# Patient Record
Sex: Female | Born: 1970 | Race: Black or African American | Hispanic: No | Marital: Single | Smoking: Current some day smoker
Health system: Southern US, Community
[De-identification: ages and names within clinical notes are randomized; demographics above are authoritative.]

## PROBLEM LIST (undated history)

## (undated) DIAGNOSIS — J45909 Unspecified asthma, uncomplicated: Secondary | ICD-10-CM

## (undated) DIAGNOSIS — F419 Anxiety disorder, unspecified: Secondary | ICD-10-CM

## (undated) HISTORY — PX: ANKLE SURGERY: SHX546

## (undated) HISTORY — PX: BELPHAROPTOSIS REPAIR: SHX369

## (undated) HISTORY — PX: SKIN GRAFT: SHX250

---

## 2012-09-19 ENCOUNTER — Emergency Department (HOSPITAL_COMMUNITY)
Admission: EM | Admit: 2012-09-19 | Discharge: 2012-09-19 | Disposition: A | Discharge status: Home / Self Care | Payer: Self-pay | Attending: Emergency Medicine | Admitting: Emergency Medicine

## 2012-09-19 ENCOUNTER — Encounter (HOSPITAL_COMMUNITY): Payer: Self-pay | Admitting: Emergency Medicine

## 2012-09-19 ENCOUNTER — Emergency Department (HOSPITAL_COMMUNITY): Payer: Self-pay

## 2012-09-19 DIAGNOSIS — F17200 Nicotine dependence, unspecified, uncomplicated: Secondary | ICD-10-CM | POA: Insufficient documentation

## 2012-09-19 DIAGNOSIS — Z8659 Personal history of other mental and behavioral disorders: Secondary | ICD-10-CM | POA: Insufficient documentation

## 2012-09-19 DIAGNOSIS — R0789 Other chest pain: Secondary | ICD-10-CM | POA: Insufficient documentation

## 2012-09-19 DIAGNOSIS — R0602 Shortness of breath: Secondary | ICD-10-CM | POA: Insufficient documentation

## 2012-09-19 DIAGNOSIS — M25519 Pain in unspecified shoulder: Secondary | ICD-10-CM | POA: Insufficient documentation

## 2012-09-19 HISTORY — DX: Anxiety disorder, unspecified: F41.9

## 2012-09-19 LAB — CBC
Hemoglobin: 13.5 g/dL (ref 12.0–15.0)
MCHC: 36.1 g/dL — ABNORMAL HIGH (ref 30.0–36.0)
RDW: 13.6 % (ref 11.5–15.5)
WBC: 8 10*3/uL (ref 4.0–10.5)

## 2012-09-19 LAB — BASIC METABOLIC PANEL
Chloride: 100 mEq/L (ref 96–112)
GFR calc Af Amer: >90 mL/min (ref >90)
GFR calc non Af Amer: >90 mL/min (ref >90)
Potassium: 3.5 mEq/L (ref 3.5–5.1)
Sodium: 137 mEq/L (ref 135–145)

## 2012-09-19 LAB — POCT I-STAT TROPONIN I

## 2012-09-19 LAB — RAPID URINE DRUG SCREEN, HOSP PERFORMED
Cocaine: NOT DETECTED
Opiates: NOT DETECTED

## 2012-09-19 MED ORDER — NAPROXEN 500 MG PO TABS: 500.0000 mg | ORAL_TABLET | Freq: Two times a day (BID) | ORAL | Status: DC

## 2012-09-19 NOTE — ED Notes (Addendum)
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 1 month ago, couldn't stand up due to pain in back of neck. Chest pain started today, left shoulder pain, left arm pain, hurts on inspiration. Denies n/v.

## 2012-09-19 NOTE — ED Provider Notes (Signed)
CSN: [id number]     Arrival date & time 09/19/12  1537 History     First MD Initiated Contact with Patient 09/19/12 1706     Chief Complaint  Patient presents with  . Chest Pain   (Consider location/radiation/quality/duration/timing/severity/associated sxs/prior Treatment) HPI Yolanda Norris is a 42 y.o. female who presents to ED with complaint of a chest pain. States she has had intermittent sharp and at times pressure like chest pains that are in different areas of her left chest. States they are non exertional. Sometimes occur at rest, sometimes when doing something. States she has shortness of breath only when she takes a deep breath. Denies SOB on excerption. No recent travel, no OCPs. No surgeries. Hx of anxiety, and also states recent loss of her mother. Pt states also has been reading a lot on the Internet and believes she may be having a heart attack. Stats currently pain in the left shoulder and upper arm. Has not tried anything for her symptoms. 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 No hx of ACS.    Past Medical History  Diagnosis Date  . Anxiety    Past Surgical History  Procedure Laterality Date  . Belpharoptosis repair     No family history on file. History  Substance Use Topics  . Smoking status: Current Some Day Smoker  . Smokeless tobacco: Not on file  . Alcohol Use: No   OB History   Grav Para Term Preterm Abortions TAB SAB Ect Mult Living                 Review of Systems  Constitutional: Negative for fever and chills.  HENT: Negative for neck pain and neck stiffness.   Respiratory: Positive for chest tightness and shortness of breath. Negative for cough and wheezing.   Cardiovascular: Positive for chest pain. Negative for palpitations and leg swelling.  Gastrointestinal: Negative.   Genitourinary: Negative for dysuria.  Skin: Negative.   Neurological: Negative for weakness and numbness.  All other systems reviewed and are  negative.    Allergies  Review of patient's allergies indicates no known allergies.  Home Medications  No current outpatient prescriptions on file. BP 136/84  Pulse 69  Temp(Src) 97.7 F (36.5 C) (Oral)  Resp 16  SpO2 99%  LMP 09/10/2012 Physical Exam  Nursing note and vitals reviewed. Constitutional: She appears well-developed and well-nourished. No distress.  Eyes: Conjunctivae are normal.  Neck: Neck supple.  Cardiovascular: Normal rate, regular rhythm and normal heart sounds.   Pulmonary/Chest: Effort normal and breath sounds normal. No respiratory distress. She has no wheezes. She has no rales.  Tender over left trapezius, left lower ribs  Abdominal: Soft. Bowel sounds are normal. She exhibits no distension. There is no tenderness. There is no rebound.  Skin: Skin is warm and dry.    ED Course   Procedures (including critical care time)  Medications - No data to display Results for orders placed during the hospital encounter of 09/19/12  CBC      Result Value Range   WBC 8.0  4.0 - 10.5 K/uL   RBC 3.93  3.87 - 5.11 MIL/uL   Hemoglobin 13.5  12.0 - 15.0 g/dL   HCT 40.9  81.1 - 91.4 %   MCV 95.2  78.0 - 100.0 fL   MCH 34.4 (*) 26.0 - 34.0 pg   MCHC 36.1 (*) 30.0 - 36.0 g/dL   RDW 78.2  95.6 - 21.3 %  Platelets 213  150 - 400 K/uL  BASIC METABOLIC PANEL      Result Value Range   Sodium 137  135 - 145 mEq/L   Potassium 3.5  3.5 - 5.1 mEq/L   Chloride 100  96 - 112 mEq/L   CO2 26  19 - 32 mEq/L   Glucose, Bld 95  70 - 99 mg/dL   BUN 6  6 - 23 mg/dL   Creatinine, Ser 4.54  0.50 - 1.10 mg/dL   Calcium 9.5  8.4 - 09.8 mg/dL   GFR calc non Af Amer >90  >90 mL/min   GFR calc Af Amer >90  >90 mL/min  URINE RAPID DRUG SCREEN (HOSP PERFORMED)      Result Value Range   Opiates NONE DETECTED  NONE DETECTED   Cocaine NONE DETECTED  NONE DETECTED   Benzodiazepines NONE DETECTED  NONE DETECTED   Amphetamines NONE DETECTED  NONE DETECTED   Tetrahydrocannabinol  POSITIVE (*) NONE DETECTED   Barbiturates NONE DETECTED  NONE DETECTED  POCT I-STAT TROPONIN I      Result Value Range   Troponin i, poc 0.00  0.00 - 0.08 ng/mL   Comment 3            Dg Chest 2 View  09/19/2012   *RADIOLOGY REPORT*  Clinical Data: Chest pain.  CHEST - 2 VIEW  Comparison:  None.  Findings:  The heart size and mediastinal contours are within normal limits.  Both lungs are clear.  The visualized skeletal structures are unremarkable.  IMPRESSION: No active cardiopulmonary disease.   Original Report Authenticated By: Myles Rosenthal, M.D.    Date: 09/19/2012  Rate: 81  Rhythm: normal sinus rhythm  QRS Axis: normal  Intervals: normal  ST/T Wave abnormalities: normal  Conduction Disutrbances:none  Narrative Interpretation:   Old EKG Reviewed: none available    1. Atypical chest pain     MDM  Pt with atypical chest pain for 1 month. todays episode started this morning and has been constant since. Here in ED, labs are unremarkable, troponin negative. ECG normal. No hx of CAD. Pt is a smoker, however, otherwise no risk factors for CAD. TIMI 0. She is PERC negative, doubt PE. Discussed smoking sessation. Pt reports being under a lot of stress and does not want to stop smoking at this time. 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 Will start on naprosyn. Pt states she has a primary care doctor she can go to on "[address] Insturcted to see them in the next 3 days. Return if worsening.   Filed Vitals:   09/19/12 1544 09/19/12 1721 09/19/12 1722 09/19/12 1814  BP: 142/73  136/84 113/79  Pulse: 75 63 69 66  Temp: 98.5 F (36.9 C) 97.7 F (36.5 C)    TempSrc: Oral Oral    Resp: 16 13 16 18   SpO2: 95% 99% 99% 98%      Lottie Mussel, PA-C 09/19/12 1844

## 2012-09-20 NOTE — ED Provider Notes (Signed)
Medical screening examination/treatment/procedure(s) were performed by non-physician practitioner and as supervising physician I was immediately available for consultation/collaboration.   Dagmar Hait, MD 09/20/12 1049

## 2013-06-02 ENCOUNTER — Emergency Department (HOSPITAL_COMMUNITY)
Admission: EM | Admit: 2013-06-02 | Discharge: 2013-06-02 | Disposition: A | Discharge status: Home / Self Care | Payer: Self-pay | Attending: Emergency Medicine | Admitting: Emergency Medicine

## 2013-06-02 ENCOUNTER — Encounter (HOSPITAL_COMMUNITY): Payer: Self-pay | Admitting: Emergency Medicine

## 2013-06-02 DIAGNOSIS — Z791 Long term (current) use of non-steroidal anti-inflammatories (NSAID): Secondary | ICD-10-CM | POA: Insufficient documentation

## 2013-06-02 DIAGNOSIS — H538 Other visual disturbances: Secondary | ICD-10-CM | POA: Insufficient documentation

## 2013-06-02 DIAGNOSIS — H109 Unspecified conjunctivitis: Secondary | ICD-10-CM | POA: Insufficient documentation

## 2013-06-02 DIAGNOSIS — Z8659 Personal history of other mental and behavioral disorders: Secondary | ICD-10-CM | POA: Insufficient documentation

## 2013-06-02 DIAGNOSIS — F17200 Nicotine dependence, unspecified, uncomplicated: Secondary | ICD-10-CM | POA: Insufficient documentation

## 2013-06-02 MED ORDER — FLUORESCEIN SODIUM 1 MG OP STRP
1.0000 | ORAL_STRIP | Freq: Once | OPHTHALMIC | Status: AC
  Administered 2013-06-02: 1 via OPHTHALMIC
  Filled 2013-06-02: qty 1

## 2013-06-02 MED ORDER — TETRACAINE HCL 0.5 % OP SOLN
2.0000 [drp] | Freq: Once | OPHTHALMIC | Status: AC
  Administered 2013-06-02: 2 [drp] via OPHTHALMIC
  Filled 2013-06-02: qty 2

## 2013-06-02 MED ORDER — POLYMYXIN B-TRIMETHOPRIM 10000-0.1 UNIT/ML-% OP SOLN
1.0000 [drp] | OPHTHALMIC | Status: DC
  Administered 2013-06-02: 1 [drp] via OPHTHALMIC
  Filled 2013-06-02: qty 10

## 2013-06-02 MED ORDER — BACITRA-NEOMYCIN-POLYMYXIN-HC 1 % OP OINT: 1.0000 "application " | TOPICAL_OINTMENT | Freq: Two times a day (BID) | OPHTHALMIC | Status: DC

## 2013-06-02 NOTE — ED Notes (Signed)
Pt reports left eye drainage, pain, swelling and irritation since Tuesday. Now having blurred vision.

## 2013-06-02 NOTE — ED Provider Notes (Signed)
CSN: [id number]     Arrival date & time 06/02/13  1454 History   First MD Initiated Contact with Patient 06/02/13 1519 This chart was scribed for non-physician practitioner Fayrene HelperBowie Briyana Badman, PA-C working with Gavin PoundMichael Y. Oletta LamasGhim, MD by Valera CastleSteven Perry, ED scribe. This patient was seen in room TR04C/TR04C and the patient's care was started at 4:12 PM.     Chief Complaint  Patient presents with  . Eye Drainage   (Consider location/radiation/quality/duration/timing/severity/associated sxs/prior Treatment) The history is provided by the patient. No language interpreter was used.   HPI Comments: Yolanda Norris is a 43 y.o. female who presents to the Emergency Department complaining of gradually worsening, constant, left eye irritation and itchiness, with associated crusting of her eyelids in the morning and watery drainage, onset  5 days ago. She also reports associated blurry vision in her left eye. She denies wearing contacts in the last 10 days. She has only tried wiping her eye. She denies having tried any eye drops. She states last night she had to hold her eye shut due to the irritation. She denies difficulty moving her eye, double vision, and any other associated symptoms. She denies allergies to medication. She denies any medical history.   PCP - No PCP Per Patient  Past Medical History  Diagnosis Date  . Anxiety    Past Surgical History  Procedure Laterality Date  . Belpharoptosis repair     History reviewed. No pertinent family history. History  Substance Use Topics  . Smoking status: Current Some Day Smoker  . Smokeless tobacco: Not on file  . Alcohol Use: No   OB History   Grav Para Term Preterm Abortions TAB SAB Ect Mult Living                 Review of Systems  Constitutional: Negative for fever.  Eyes: Positive for pain (left eye), discharge and itching. Negative for visual disturbance.   Allergies  Review of patient's allergies indicates no known allergies.  Home Medications    Prior to Admission medications   Medication Sig Start Date End Date Taking? Authorizing Provider  naproxen (NAPROSYN) 500 MG tablet Take 1 tablet (500 mg total) by mouth 2 (two) times daily. 09/19/12   Tatyana A Kirichenko, PA-C   BP 134/91  Pulse 97  Temp(Src) 98.7 F (37.1 C) (Oral)  Resp 16  Wt 128 lb 6 oz (58.231 kg)  SpO2 100%  LMP 05/09/2013  Physical Exam  Nursing note and vitals reviewed. Constitutional: She is oriented to person, place, and time. She appears well-developed and well-nourished. No distress.  HENT:  Head: Normocephalic and atraumatic.  Eyes: EOM are normal. Pupils are equal, round, and reactive to light.  Left eye both upper and lower eyelids are midly edematous. Mild discharge noted in the medial canthus. Conjunctiva is injected. No foreign body or signs of trauma noted. Per eye exam: No corneal abrasion. No dendritic lesions. No ulcerations. Negative seidel signs. No signs of hyphema and no ecchymosis.   Neck: Neck supple. No tracheal deviation present.  Cardiovascular: Normal rate.   Pulmonary/Chest: Effort normal. No respiratory distress.  Musculoskeletal: Normal range of motion.  Neurological: She is alert and oriented to person, place, and time.  Skin: Skin is warm and dry.  Psychiatric: She has a normal mood and affect. Her behavior is normal.    ED Course  Procedures (including critical care time)  \DIAGNOSTIC STUDIES: Oxygen Saturation is 100% on room air, normal by my interpretation.  COORDINATION OF CARE: 4:17 PM-Discussed treatment plan which includes an eye exam with pt at bedside and pt agreed to plan.   4:34 PM - Eye exam started. No corneal abrasions. No ulcers. No dendritic lesions. Negative seidel signs. No hyphema or ecchymosis. Discussed with pt clinical suspicion of eye infection. Will give pt eye drops to use for 5 days.   Labs Review Labs Reviewed - No data to display  Imaging Review No results found.   EKG  Interpretation None     Medications  trimethoprim-polymyxin b (POLYTRIM) ophthalmic solution 1 drop (not administered)  fluorescein ophthalmic strip 1 strip (1 strip Both Eyes Given 06/02/13 1623)  tetracaine (PONTOCAINE) 0.5 % ophthalmic solution 2 drop (2 drops Right Eye Given 06/02/13 1623)   MDM   Final diagnoses:  Conjunctivitis, left eye    BP 134/91  Pulse 97  Temp(Src) 98.7 F (37.1 C) (Oral)  Resp 16  Wt 128 lb 6 oz (58.231 kg)  SpO2 100%  LMP 05/09/2013   I personally performed the services described in this documentation, which was scribed in my presence. The recorded information has been reviewed and is accurate.     Fayrene HelperBowie Tahirah Sara, PA-C 06/02/13 1646

## 2013-06-02 NOTE — Discharge Instructions (Signed)
Please apply 1 drop of antibiotic eye drop to the affected eye every 4 hrs for the next 5 days. If after 3 days of use and you notice no improvement, please follow up with eye specialist for further care.    Conjunctivitis Conjunctivitis is commonly called "pink eye." Conjunctivitis can be caused by bacterial or viral infection, allergies, or injuries. There is usually redness of the lining of the eye, itching, discomfort, and sometimes discharge. There may be deposits of matter along the eyelids. A viral infection usually causes a watery discharge, while a bacterial infection causes a yellowish, thick discharge. Pink eye is very contagious and spreads by direct contact. You may be given antibiotic eyedrops as part of your treatment. Before using your eye medicine, remove all drainage from the eye by washing gently with warm water and cotton balls. Continue to use the medication until you have awakened 2 mornings in a row without discharge from the eye. Do not rub your eye. This increases the irritation and helps spread infection. Use separate towels from other household members. Wash your hands with soap and water before and after touching your eyes. Use cold compresses to reduce pain and sunglasses to relieve irritation from light. Do not wear contact lenses or wear eye makeup until the infection is gone. SEEK MEDICAL CARE IF:   Your symptoms are not better after 3 days of treatment.  You have increased pain or trouble seeing.  The outer eyelids become very red or swollen. Document Released: 03/04/2004 Document Revised: 04/19/2011 Document Reviewed: 01/25/2005 [facility] Patient Information 2014 [place], [place].

## 2013-06-02 NOTE — ED Notes (Signed)
Pharmacy Notified to send med.

## 2013-06-02 NOTE — ED Notes (Signed)
Pt comfortable with discharge and follow up instructions. Prescriptions x1 

## 2013-06-03 NOTE — ED Provider Notes (Signed)
Medical screening examination/treatment/procedure(s) were performed by non-physician practitioner and as supervising physician I was immediately available for consultation/collaboration.  Kairi Harshbarger Y. Edilberto Roosevelt, MD 06/03/13 0038 

## 2016-02-28 ENCOUNTER — Encounter (HOSPITAL_COMMUNITY): Payer: Self-pay | Admitting: Emergency Medicine

## 2016-02-28 ENCOUNTER — Emergency Department (HOSPITAL_COMMUNITY)
Admission: EM | Admit: 2016-02-28 | Discharge: 2016-02-28 | Disposition: A | Discharge status: Home / Self Care | Payer: Managed Care, Other (non HMO) | Attending: Emergency Medicine | Admitting: Emergency Medicine

## 2016-02-28 ENCOUNTER — Emergency Department (HOSPITAL_COMMUNITY): Payer: Managed Care, Other (non HMO)

## 2016-02-28 DIAGNOSIS — S82235A Nondisplaced oblique fracture of shaft of left tibia, initial encounter for closed fracture: Secondary | ICD-10-CM | POA: Insufficient documentation

## 2016-02-28 DIAGNOSIS — Y939 Activity, unspecified: Secondary | ICD-10-CM | POA: Diagnosis not present

## 2016-02-28 DIAGNOSIS — Y929 Unspecified place or not applicable: Secondary | ICD-10-CM | POA: Insufficient documentation

## 2016-02-28 DIAGNOSIS — S99912A Unspecified injury of left ankle, initial encounter: Secondary | ICD-10-CM | POA: Diagnosis present

## 2016-02-28 DIAGNOSIS — Y999 Unspecified external cause status: Secondary | ICD-10-CM | POA: Diagnosis not present

## 2016-02-28 DIAGNOSIS — F17200 Nicotine dependence, unspecified, uncomplicated: Secondary | ICD-10-CM | POA: Diagnosis not present

## 2016-02-28 DIAGNOSIS — W002XXA Other fall from one level to another due to ice and snow, initial encounter: Secondary | ICD-10-CM | POA: Insufficient documentation

## 2016-02-28 DIAGNOSIS — S82892A Other fracture of left lower leg, initial encounter for closed fracture: Secondary | ICD-10-CM

## 2016-02-28 MED ORDER — IBUPROFEN 600 MG PO TABS: 600.0000 mg | ORAL_TABLET | Freq: Four times a day (QID) | ORAL | 0 refills | Status: DC | PRN

## 2016-02-28 MED ORDER — HYDROCODONE-ACETAMINOPHEN 5-325 MG PO TABS: 1.0000 | ORAL_TABLET | Freq: Four times a day (QID) | ORAL | 0 refills | Status: DC | PRN

## 2016-02-28 NOTE — ED Triage Notes (Signed)
Pt c/o slip and fall causing pain to left ankle.

## 2016-02-28 NOTE — ED Provider Notes (Signed)
[facility]-EMERGENCY DEPT Provider Note   CSN: [id number] Arrival date & time: 02/28/16  1108  By signing my name below, I, Soijett Blue, attest that this documentation has been prepared under the direction and in the presence of Jaynie Crumbleatyana Jadelyn Elks, VF CorporationPA-C Electronically Signed: Soijett Blue, ED Scribe. 02/28/16. 1:28 PM.  History   Chief Complaint Chief Complaint  Patient presents with  . Fall  . Ankle Injury    HPI Yolanda Norris is a 46 y.o. female who presents to the Emergency Department complaining of a fall onset 9 AM this morning. Pt notes that she slipped on black ice and braced her fall with her hands. Pt is having associated symptoms of left ankle pain, left ankle swelling, and gait problem due to pain. She has tried ice without any medications with no relief of her symptoms. She denies hitting her head, LOC, color change, wound, and any other symptoms. Denies having an orthopedist. 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    The history is provided by the patient. No language interpreter was used.    Past Medical History:  Diagnosis Date  . Anxiety     There are no active problems to display for this patient.   Past Surgical History:  Procedure Laterality Date  . BELPHAROPTOSIS REPAIR      OB History    No data available       Home Medications    Prior to Admission medications   Medication Sig Start Date End Date Taking? Authorizing Provider  bacitracin-neomycin-polymyxin-hydrocortisone (CORTISPORIN) 1 % ophthalmic ointment Place 1 application into the left eye 2 (two) times daily. Apply a ribbon of ointment to your lower eyelid twice daily for the next 5 days 06/02/13   Fayrene HelperBowie Tran, PA-C    Family History No family history on file.  Social History Social History  Substance Use Topics  . Smoking status: Current Some Day Smoker  . Smokeless tobacco: Never Used  . Alcohol use No     Allergies   Patient has no known allergies.   Review of  Systems Review of Systems  Musculoskeletal: Positive for arthralgias (left ankle), gait problem (due to pain) and joint swelling (left ankle).  Skin: Negative for color change and wound.  Neurological: Negative for syncope.     Physical Exam Updated Vital Signs BP 100/68 (BP Location: Right Arm)   Pulse 72   Temp 98.5 F (36.9 C) (Oral)   Resp 14   Ht 5\' 3"  (1.6 m)   Wt 134 lb (60.8 kg)   SpO2 100%   BMI 23.74 kg/m   Physical Exam  Constitutional: She is oriented to person, place, and time. She appears well-developed and well-nourished. No distress.  HENT:  Head: Normocephalic and atraumatic.  Eyes: EOM are normal.  Neck: Neck supple.  Cardiovascular: Normal rate.   Pulmonary/Chest: Effort normal. No respiratory distress.  Abdominal: She exhibits no distension.  Musculoskeletal: Normal range of motion.  Mild swelling noted to the lateral malleolus of the left ankle. Tender to palpation over lateral malleolus. Achilles tendon is intact. Normal foot. No medial malleolus tenderness. Mild tenderness palpation of the left lateral knee. Pain range motion of the knee. Unable to move ankle due to pain. Dorsal pedal pulses intact.  Neurological: She is alert and oriented to person, place, and time.  Skin: Skin is warm and dry.  Psychiatric: She has a normal mood and affect. Her behavior is normal.  Nursing note and vitals reviewed.  ED Treatments / Results  DIAGNOSTIC STUDIES: Oxygen Saturation is 100% on RA, nl by my interpretation.    COORDINATION OF CARE: 1:26 PM Discussed treatment plan with pt at bedside which includes left ankle xray, left knee xray, ice, cam-walker boot, and pt agreed to plan.  Radiology Dg Ankle Complete Left  Result Date: 02/28/2016 CLINICAL DATA:  Status post fall, left ankle pain EXAM: LEFT ANKLE COMPLETE - 3+ VIEW COMPARISON:  None. FINDINGS: Oblique nondisplaced fracture of the distal left fibular metaphysis with overlying soft tissue swelling.  No significant displacement or angulation. No other fracture or dislocation.  No other soft tissue abnormality. IMPRESSION: 1. Acute, oblique nondisplaced fracture of the left distal fibular metaphysis with overlying soft tissue swelling. Electronically Signed   By: Elige Ko   On: 02/28/2016 13:11   Dg Knee Complete 4 Views Left  Result Date: 02/28/2016 CLINICAL DATA:  Pain after trauma. EXAM: LEFT KNEE - COMPLETE 4+ VIEW COMPARISON:  None. FINDINGS: No evidence of fracture, dislocation, or joint effusion. No evidence of arthropathy or other focal bone abnormality. Soft tissues are unremarkable. IMPRESSION: Negative. Electronically Signed   By: Gerome Sam III M.D   On: 02/28/2016 14:27    Procedures Procedures (including critical care time)  Medications Ordered in ED Medications - No data to display   Initial Impression / Assessment and Plan / ED Course  I have reviewed the triage vital signs and the nursing notes.  Pertinent imaging results that were available during my care of the patient were reviewed by me and considered in my medical decision making (see chart for details).  Patient emergency department with left ankle pain after falling on ice. X-rays consistent with oblique nondisplaced fracture of the left distal fibular metaphysis. Placed in a cam walker, ice, elevate at home, ibuprofen for pain, Norco for severe pain, follow-up with orthopedics. Return precautions discussed. Neurovascularly intact at time of discharge.  Vitals:   02/28/16 1140 02/28/16 1142 02/28/16 1349  BP: 100/68  118/68  Pulse: 72  83  Resp: 14  16  Temp: 98.5 F (36.9 C)    TempSrc: Oral    SpO2: 100%  100%  Weight:  60.8 kg   Height:  5\' 3"  (1.6 m)      Final Clinical Impressions(s) / ED Diagnoses   Final diagnoses:  Closed fracture of left ankle, initial encounter    New Prescriptions Discharge Medication List as of 02/28/2016  2:34 PM    START taking these medications   Details   HYDROcodone-acetaminophen (NORCO) 5-325 MG tablet Take 1 tablet by mouth every 6 (six) hours as needed for moderate pain., Starting Sat 02/28/2016, Print    ibuprofen (ADVIL,MOTRIN) 600 MG tablet Take 1 tablet (600 mg total) by mouth every 6 (six) hours as needed., Starting Sat 02/28/2016, Print       I personally performed the services described in this documentation, which was scribed in my presence. The recorded information has been reviewed and is accurate.     Jaynie Crumble, PA-C 02/28/16 1629    Azalia Bilis, MD 02/28/16 [phone]

## 2016-02-28 NOTE — Discharge Instructions (Signed)
Ice and elevate your ankle at home. Ibuprofen for pain. Norco for severe pain. Please follow-up with orthopedic specialist is referred. Crutches until released to walk by orthopedics. Light duty at work.

## 2016-08-24 ENCOUNTER — Emergency Department (HOSPITAL_COMMUNITY)
Admission: EM | Admit: 2016-08-24 | Discharge: 2016-08-24 | Disposition: A | Discharge status: Home / Self Care | Payer: Managed Care, Other (non HMO) | Attending: Emergency Medicine | Admitting: Emergency Medicine

## 2016-08-24 ENCOUNTER — Encounter (HOSPITAL_COMMUNITY): Payer: Self-pay

## 2016-08-24 DIAGNOSIS — H578 Other specified disorders of eye and adnexa: Secondary | ICD-10-CM | POA: Diagnosis present

## 2016-08-24 DIAGNOSIS — R3989 Other symptoms and signs involving the genitourinary system: Secondary | ICD-10-CM | POA: Insufficient documentation

## 2016-08-24 DIAGNOSIS — H10021 Other mucopurulent conjunctivitis, right eye: Secondary | ICD-10-CM | POA: Insufficient documentation

## 2016-08-24 DIAGNOSIS — F17210 Nicotine dependence, cigarettes, uncomplicated: Secondary | ICD-10-CM | POA: Diagnosis not present

## 2016-08-24 DIAGNOSIS — F419 Anxiety disorder, unspecified: Secondary | ICD-10-CM | POA: Diagnosis not present

## 2016-08-24 DIAGNOSIS — H1031 Unspecified acute conjunctivitis, right eye: Secondary | ICD-10-CM

## 2016-08-24 LAB — URINALYSIS, ROUTINE W REFLEX MICROSCOPIC
Bilirubin Urine: NEGATIVE
GLUCOSE, UA: NEGATIVE mg/dL
Hgb urine dipstick: NEGATIVE
Ketones, ur: NEGATIVE mg/dL
LEUKOCYTES UA: NEGATIVE
Nitrite: NEGATIVE
PH: 5.5 (ref 5.0–8.0)
Protein, ur: NEGATIVE mg/dL

## 2016-08-24 MED ORDER — POLYMYXIN B-TRIMETHOPRIM 10000-0.1 UNIT/ML-% OP SOLN: 1.0000 [drp] | OPHTHALMIC | 0 refills | Status: DC

## 2016-08-24 NOTE — Discharge Instructions (Signed)
It was my pleasure taking care of you today!   Use eye drops as directed.   Pink eye is very contagious and spreads by direct contact. Try to avoid rubbing eyes and wash hands often.  You may be given antibiotic eyedrops as part of your treatment. Before using your eye medicine, remove all drainage from the eye by washing gently with warm water and cotton balls. Continue to use the medication until you have awakened 2 mornings in a row without discharge from the eye. Do not rub your eye. This increases the irritation and helps spread infection. Use separate towels from other household members. Wash your hands with soap and water before and after touching your eyes. Use cold compresses to reduce pain and sunglasses to relieve irritation from light. Do not wear contact lenses or wear eye makeup until the infection is gone.   SEEK MEDICAL CARE IF:  Your symptoms are not better after 3 days of treatment.  You have increased pain or trouble seeing.  The outer eyelids become very red or swollen.  You develop double vision or your vision becomes blurred or worsens in any way.  You have trouble moving your eyes.  You develop a severe headache, severe neck pain, or neck stiffness.  You develop repeated vomiting.  You have a fever or persistent symptoms for more than 72 hours.  You have a fever and your symptoms suddenly get worse.

## 2016-08-24 NOTE — ED Provider Notes (Signed)
[facility] Provider Note   CSN: [id number] Arrival date & time: 08/24/16  [id number]     History   Chief Complaint Chief Complaint  Patient presents with  . Conjunctivitis    HPI Yolanda Norris is a 46 y.o. female.  The history is provided by the patient and medical records. No language interpreter was used.  Conjunctivitis    Yolanda Norris is a 46 y.o. female  who presents to the Emergency Department with two  complaints:  1. Gradually worsening right eye redness 4 days. Initially noted a clear discharge and itching. Symptoms started after her line was noted, therefore she thought it was allergies. This morning, she awoke with a thick mucousy discharge to the eye and worsening redness. No visual changes. No foreign body sensation. No medications taken prior to arrival for symptoms. No alleviating/aggravating factors noted. No other sick contacts.   2. Foul-smelling urine for 3-4 days. Also feels like urine is much more dark than usual despite drinking plenty of water. No dysuria, urinary urgency/frequency. Is not sexually active.   Past Medical History:  Diagnosis Date  . Anxiety     There are no active problems to display for this patient.   Past Surgical History:  Procedure Laterality Date  . ANKLE SURGERY    . BELPHAROPTOSIS REPAIR    . SKIN GRAFT      OB History    No data available       Home Medications    Prior to Admission medications   Medication Sig Start Date End Date Taking? Authorizing Provider  bacitracin-neomycin-polymyxin-hydrocortisone (CORTISPORIN) 1 % ophthalmic ointment Place 1 application into the left eye 2 (two) times daily. Apply a ribbon of ointment to your lower eyelid twice daily for the next 5 days 06/02/13   Fayrene Helperran, Bowie, PA-C  HYDROcodone-acetaminophen (NORCO) 5-325 MG tablet Take 1 tablet by mouth every 6 (six) hours as needed for moderate pain. 02/28/16   Kirichenko, Tatyana, PA-C  ibuprofen (ADVIL,MOTRIN) 600 MG tablet Take  1 tablet (600 mg total) by mouth every 6 (six) hours as needed. 02/28/16   Kirichenko, Lemont Fillersatyana, PA-C  trimethoprim-polymyxin b (POLYTRIM) ophthalmic solution Place 1 drop into the right eye every 4 (four) hours. 08/24/16   Ward, Chase PicketJaime Pilcher, PA-C    Family History No family history on file.  Social History Social History  Substance Use Topics  . Smoking status: Current Some Day Smoker    Packs/day: 0.50    Types: Cigarettes  . Smokeless tobacco: Never Used  . Alcohol use No     Allergies   Patient has no known allergies.   Review of Systems Review of Systems  Eyes: Positive for discharge, redness and itching. Negative for photophobia, pain and visual disturbance.  Genitourinary: Negative for dysuria, frequency, urgency and vaginal discharge.       + foul-smelling urine.     Physical Exam Updated Vital Signs BP 118/76 (BP Location: Left Arm)   Pulse 75   Temp 98.5 F (36.9 C) (Oral)   Resp 14   Ht 5\' 3"  (1.6 m)   Wt 65.8 kg (145 lb)   LMP 08/15/2016   SpO2 99%   BMI 25.69 kg/m   Physical Exam  Constitutional: She appears well-developed and well-nourished. No distress.  HENT:  Head: Normocephalic and atraumatic.  Eyes: Pupils are equal, round, and reactive to light. EOM are normal. Right eye exhibits discharge. Right conjunctiva is injected.  Neck: Neck supple.  Cardiovascular: Normal rate, regular rhythm and  normal heart sounds.   No murmur heard. Pulmonary/Chest: Effort normal and breath sounds normal. No respiratory distress. She has no wheezes. She has no rales.  Abdominal:  No abdominal tenderness.  Musculoskeletal: Normal range of motion.  Neurological: She is alert.  Skin: Skin is warm and dry.  Nursing note and vitals reviewed.    ED Treatments / Results  Labs (all labs ordered are listed, but only abnormal results are displayed) Labs Reviewed  URINALYSIS, ROUTINE W REFLEX MICROSCOPIC - Abnormal; Notable for the following:       Result Value    Specific Gravity, Urine >1.030 (*)    All other components within normal limits    EKG  EKG Interpretation None       Radiology No results found.  Procedures Procedures (including critical care time)  Medications Ordered in ED Medications - No data to display   Initial Impression / Assessment and Plan / ED Course  I have reviewed the triage vital signs and the nursing notes.  Pertinent labs & imaging results that were available during my care of the patient were reviewed by me and considered in my medical decision making (see chart for details).    Yolanda Norris is a 46 y.o. female who presents to ED for two complaints:  1. Eye redness 4 days which has acutely worsened and progressed to purulent discharge today. Exam consistent with conjunctivitis. Will treat with antibiotic drops. PCP follow-up if no improvement.  2. Foul-smelling urine. UA negative for infection. She is not sexually active.   Reasons to return to ER discussed and all questions answered.   Final Clinical Impressions(s) / ED Diagnoses   Final diagnoses:  Acute bacterial conjunctivitis of right eye    New Prescriptions New Prescriptions   TRIMETHOPRIM-POLYMYXIN B (POLYTRIM) OPHTHALMIC SOLUTION    Place 1 drop into the right eye every 4 (four) hours.     Ward, Chase Picket, PA-C 08/24/16 1033    Alvira Monday, MD 08/25/16 1354

## 2016-08-24 NOTE — ED Notes (Signed)
Pt is in stable condition upon d/c and ambulates from ED. 

## 2016-08-24 NOTE — ED Triage Notes (Signed)
Per Pt, Pt reports having surgery on her left ankle 2/26. Pt denies any problems at this time with her left ankle. Pt has come in because she has redness, erythema, and clear discharge to the right eye. Pt also reports ammonia smell to her urine, but denies any other urinary symptoms.

## 2017-03-10 ENCOUNTER — Ambulatory Visit (HOSPITAL_COMMUNITY)
Admission: EM | Admit: 2017-03-10 | Discharge: 2017-03-10 | Disposition: A | Discharge status: Home / Self Care | Payer: Managed Care, Other (non HMO) | Attending: Family Medicine | Admitting: Family Medicine

## 2017-03-10 ENCOUNTER — Encounter (HOSPITAL_COMMUNITY): Payer: Self-pay | Admitting: Emergency Medicine

## 2017-03-10 DIAGNOSIS — M791 Myalgia, unspecified site: Secondary | ICD-10-CM

## 2017-03-10 DIAGNOSIS — Z8701 Personal history of pneumonia (recurrent): Secondary | ICD-10-CM

## 2017-03-10 DIAGNOSIS — R6889 Other general symptoms and signs: Secondary | ICD-10-CM

## 2017-03-10 DIAGNOSIS — R05 Cough: Secondary | ICD-10-CM

## 2017-03-10 DIAGNOSIS — R062 Wheezing: Secondary | ICD-10-CM

## 2017-03-10 DIAGNOSIS — R109 Unspecified abdominal pain: Secondary | ICD-10-CM | POA: Diagnosis not present

## 2017-03-10 MED ORDER — ALBUTEROL SULFATE HFA 108 (90 BASE) MCG/ACT IN AERS: 2.0000 | INHALATION_SPRAY | RESPIRATORY_TRACT | 0 refills | Status: DC | PRN

## 2017-03-10 MED ORDER — OSELTAMIVIR PHOSPHATE 75 MG PO CAPS: 75.0000 mg | ORAL_CAPSULE | Freq: Two times a day (BID) | ORAL | 0 refills | Status: DC

## 2017-03-10 MED ORDER — AZITHROMYCIN 250 MG PO TABS: ORAL_TABLET | ORAL | 0 refills | Status: DC

## 2017-03-10 MED ORDER — IPRATROPIUM-ALBUTEROL 0.5-2.5 (3) MG/3ML IN SOLN
3.0000 mL | Freq: Once | RESPIRATORY_TRACT | Status: AC
  Administered 2017-03-10: 3 mL via RESPIRATORY_TRACT

## 2017-03-10 MED ORDER — IPRATROPIUM-ALBUTEROL 0.5-2.5 (3) MG/3ML IN SOLN
RESPIRATORY_TRACT | Status: AC
  Filled 2017-03-10: qty 3

## 2017-03-10 NOTE — ED Provider Notes (Signed)
03/10/2017 5:31 PM   DOB: 1970/09/21 / MRN: [id number]  SUBJECTIVE:  Yolanda Norris is a 47 y.o. female presenting for "influenza."  Patient has a cough and some abdominal discomfort.  Associated some shortness of breath.  She is a smoker.  She has been prescribed albuterol in the past however she tells me "never been diagnosed with asthma.  She associates muscle aches.  He is tried several over-the-counter remedies all with some mild relief.  States that she has a history of pneumonia.  She denies chest pain, leg swelling, diaphoresis.  She has No Known Allergies.   She  has a past medical history of Anxiety.    She  reports that she has been smoking cigarettes.  She has been smoking about 0.50 packs per day. she has never used smokeless tobacco. She reports that she does not drink alcohol or use drugs. She  has no sexual activity history on file. The patient  has a past surgical history that includes Blepharoptosis repair; Ankle surgery; and Skin graft.  Her family history is not on file.  Review of Systems  Constitutional: Negative for chills, diaphoresis and fever.  Eyes: Negative.   Respiratory: Positive for cough and shortness of breath. Negative for hemoptysis, sputum production and wheezing.   Cardiovascular: Negative for chest pain, orthopnea and leg swelling.  Gastrointestinal: Negative for nausea.  Skin: Negative for rash.  Neurological: Negative for dizziness, sensory change, speech change, focal weakness and headaches.    OBJECTIVE:  BP (!) 143/77   Pulse (!) 115   Temp 99.9 F (37.7 C) (Oral)   Resp 16   Wt 145 lb (65.8 kg)   LMP 02/14/2017   SpO2 98%   BMI 25.69 kg/m   Physical Exam  Constitutional: She is oriented to person, place, and time. She is active.  Non-toxic appearance.  Cardiovascular: Normal rate, regular rhythm, S1 normal, S2 normal, normal heart sounds and intact distal pulses. Exam reveals no gallop, no friction rub and no decreased pulses.  No  murmur heard. Pulmonary/Chest: Effort normal. No stridor. No tachypnea. No respiratory distress. She has wheezes. She has no rales.  Abdominal: She exhibits no distension.  Musculoskeletal: She exhibits no edema.  Neurological: She is alert and oriented to person, place, and time.  Skin: Skin is warm and dry. She is not diaphoretic. No pallor.    ASSESSMENT AND PLAN:    Wheezing; shortness of breath did improve with nebulizer.  Patient has a history of pneumonia.  Fairly convinced that she does have the fluid will cover for both flu and pneumonia I am refilling her inhaler today.  Flu-like symptoms  History of pneumonia      The patient is advised to call or return to clinic if she does not see an improvement in symptoms, or to seek the care of the closest emergency department if she worsens with the above plan.   Deliah BostonMichael Clark, MHS, PA-C 03/10/2017 5:31 PM    Ofilia Neaslark, Michael L, PA-C 03/10/17 1732

## 2017-03-10 NOTE — Discharge Instructions (Signed)
Please take medication as prescribed.  If not improving please follow-up here or with your PCP.

## 2017-03-10 NOTE — ED Triage Notes (Signed)
PT reports she got sick Saturday. PT reports vomiting Sunday. PT developed cough last night. Low grade fever in triage.

## 2017-03-10 NOTE — ED Triage Notes (Signed)
PT reports SOB as well.

## 2017-05-22 ENCOUNTER — Ambulatory Visit (HOSPITAL_COMMUNITY)
Admission: EM | Admit: 2017-05-22 | Discharge: 2017-05-22 | Disposition: A | Discharge status: Home / Self Care | Payer: Managed Care, Other (non HMO) | Attending: Urgent Care | Admitting: Urgent Care

## 2017-05-22 ENCOUNTER — Ambulatory Visit (INDEPENDENT_AMBULATORY_CARE_PROVIDER_SITE_OTHER): Payer: Managed Care, Other (non HMO)

## 2017-05-22 ENCOUNTER — Other Ambulatory Visit: Payer: Self-pay

## 2017-05-22 ENCOUNTER — Encounter (HOSPITAL_COMMUNITY): Payer: Self-pay | Admitting: *Deleted

## 2017-05-22 DIAGNOSIS — Z9109 Other allergy status, other than to drugs and biological substances: Secondary | ICD-10-CM | POA: Diagnosis not present

## 2017-05-22 DIAGNOSIS — R0789 Other chest pain: Secondary | ICD-10-CM

## 2017-05-22 DIAGNOSIS — R0602 Shortness of breath: Secondary | ICD-10-CM

## 2017-05-22 DIAGNOSIS — R059 Cough, unspecified: Secondary | ICD-10-CM

## 2017-05-22 DIAGNOSIS — R062 Wheezing: Secondary | ICD-10-CM | POA: Diagnosis not present

## 2017-05-22 DIAGNOSIS — Z720 Tobacco use: Secondary | ICD-10-CM

## 2017-05-22 DIAGNOSIS — R05 Cough: Secondary | ICD-10-CM

## 2017-05-22 DIAGNOSIS — T7840XA Allergy, unspecified, initial encounter: Secondary | ICD-10-CM | POA: Diagnosis not present

## 2017-05-22 DIAGNOSIS — F17200 Nicotine dependence, unspecified, uncomplicated: Secondary | ICD-10-CM

## 2017-05-22 MED ORDER — CETIRIZINE HCL 10 MG PO TABS: 10.0000 mg | ORAL_TABLET | Freq: Every day | ORAL | 1 refills | Status: DC

## 2017-05-22 MED ORDER — IPRATROPIUM-ALBUTEROL 0.5-2.5 (3) MG/3ML IN SOLN
RESPIRATORY_TRACT | Status: AC
  Filled 2017-05-22: qty 3

## 2017-05-22 MED ORDER — METHYLPREDNISOLONE ACETATE 80 MG/ML IJ SUSP
INTRAMUSCULAR | Status: AC
Start: 2017-05-22 — End: ?
  Filled 2017-05-22: qty 1

## 2017-05-22 MED ORDER — MONTELUKAST SODIUM 10 MG PO TABS: 10.0000 mg | ORAL_TABLET | Freq: Every day | ORAL | 0 refills | Status: DC

## 2017-05-22 MED ORDER — METHYLPREDNISOLONE ACETATE 80 MG/ML IJ SUSP
80.0000 mg | Freq: Once | INTRAMUSCULAR | Status: AC
  Administered 2017-05-22: 80 mg via INTRAMUSCULAR

## 2017-05-22 MED ORDER — LORATADINE 10 MG PO TABS: 10.0000 mg | ORAL_TABLET | Freq: Every day | ORAL | 1 refills | Status: DC

## 2017-05-22 MED ORDER — IPRATROPIUM-ALBUTEROL 0.5-2.5 (3) MG/3ML IN SOLN
3.0000 mL | Freq: Once | RESPIRATORY_TRACT | Status: AC
  Administered 2017-05-22: 3 mL via RESPIRATORY_TRACT

## 2017-05-22 MED ORDER — ALBUTEROL SULFATE HFA 108 (90 BASE) MCG/ACT IN AERS: 2.0000 | INHALATION_SPRAY | RESPIRATORY_TRACT | 0 refills | Status: DC | PRN

## 2017-05-22 NOTE — ED Triage Notes (Signed)
Per pt she is problems breathing. Per pt she has asthma and her inhaler is not working, congestion, flame, per pt she also have allergies, leg and pelvic area hurts, per pt it's like everything is going on at one time.

## 2017-05-22 NOTE — ED Provider Notes (Signed)
  MRN: [id number] DOB: February 27, 1970  Subjective:   Yolanda Norris is a 47 y.o. female presenting for 3-day history of difficulty breathing, shortness of breath, wheezing, productive cough.  Symptoms have elicited chest pain chest tightness.  Patient was diagnosed sometime ago with nocturnal asthma.  She has been using her albuterol inhaler once every 2-4 hours and notes that she has had worsening symptoms over the months.  She is an active smoker, has 20-pack-year history.  Patient works with the [employer], delivers mail.  She does not take anything for allergies.  Does not take chronic medications.   No Known Allergies  Past Medical History:  Diagnosis Date  . Anxiety      Past Surgical History:  Procedure Laterality Date  . ANKLE SURGERY    . BELPHAROPTOSIS REPAIR    . SKIN GRAFT      Objective:   Vitals: BP 128/74 (BP Location: Right Arm)   Pulse (!) 105   Temp 99.5 F (37.5 C) (Oral)   Resp (!) 22   LMP 05/11/2017 (Exact Date)   SpO2 97% Comment: post breathing treatment  Physical Exam  Constitutional: She is oriented to person, place, and time. She appears well-developed and well-nourished.  HENT:  Mouth/Throat: Oropharynx is clear and moist.  Conjunctive injected bilaterally.  Eyes: Right eye exhibits no discharge. Left eye exhibits no discharge. No scleral icterus.  Cardiovascular: Normal rate, regular rhythm and intact distal pulses. Exam reveals no gallop and no friction rub.  No murmur heard. Pulmonary/Chest: No respiratory distress. She has no wheezes. She has rales (right lower lung base).  Neurological: She is alert and oriented to person, place, and time.  Skin: Skin is warm and dry.  Psychiatric: She has a normal mood and affect.   Dg Chest 2 View  Result Date: 05/22/2017 CLINICAL DATA:  Shortness of breath and fever EXAM: CHEST - 2 VIEW COMPARISON:  September 19, 2012 FINDINGS: The lungs are clear. Heart size and pulmonary vascularity are normal. No  adenopathy. No pneumothorax. No bone lesions. IMPRESSION: No edema or consolidation. Electronically Signed   By: Bretta BangWilliam  Woodruff III M.D.   On: 05/22/2017 13:49   Assessment and Plan :   Cough  Shortness of breath  Wheezing  Atypical chest pain  Environmental allergies  Tobacco use disorder  Patient states that she is worried about taking a lot of medications together.  I counseled that these are safe for her to take and in fact warranted given her severe allergies.  I also recommend that she quit smoking as this is a serious health hazard. Counseled patient on potential for adverse effects with medications prescribed today, patient verbalized understanding. Return-to-clinic precautions discussed, patient verbalized understanding.    Yolanda Norris, Yolanda Norris, [place]C 05/22/17 [phone]

## 2017-06-13 ENCOUNTER — Other Ambulatory Visit: Payer: Self-pay | Admitting: Urgent Care

## 2017-08-22 ENCOUNTER — Ambulatory Visit (HOSPITAL_COMMUNITY)
Admission: EM | Admit: 2017-08-22 | Discharge: 2017-08-22 | Disposition: A | Discharge status: Home / Self Care | Payer: Managed Care, Other (non HMO) | Attending: Family Medicine | Admitting: Family Medicine

## 2017-08-22 ENCOUNTER — Encounter (HOSPITAL_COMMUNITY): Payer: Self-pay | Admitting: Emergency Medicine

## 2017-08-22 ENCOUNTER — Other Ambulatory Visit: Payer: Self-pay

## 2017-08-22 DIAGNOSIS — F17210 Nicotine dependence, cigarettes, uncomplicated: Secondary | ICD-10-CM | POA: Insufficient documentation

## 2017-08-22 DIAGNOSIS — F419 Anxiety disorder, unspecified: Secondary | ICD-10-CM | POA: Insufficient documentation

## 2017-08-22 DIAGNOSIS — Z79899 Other long term (current) drug therapy: Secondary | ICD-10-CM | POA: Diagnosis not present

## 2017-08-22 DIAGNOSIS — R51 Headache: Secondary | ICD-10-CM | POA: Insufficient documentation

## 2017-08-22 DIAGNOSIS — J3089 Other allergic rhinitis: Secondary | ICD-10-CM | POA: Insufficient documentation

## 2017-08-22 DIAGNOSIS — N898 Other specified noninflammatory disorders of vagina: Secondary | ICD-10-CM

## 2017-08-22 LAB — POCT URINALYSIS DIP (DEVICE)
Glucose, UA: NEGATIVE mg/dL
KETONES UR: NEGATIVE mg/dL
Leukocytes, UA: NEGATIVE
NITRITE: NEGATIVE
PH: 5.5 (ref 5.0–8.0)
Protein, ur: NEGATIVE mg/dL
Urobilinogen, UA: 1 mg/dL (ref 0.0–1.0)

## 2017-08-22 MED ORDER — IPRATROPIUM BROMIDE 0.06 % NA SOLN: 2.0000 | Freq: Four times a day (QID) | NASAL | 0 refills | Status: DC

## 2017-08-22 MED ORDER — FLUTICASONE PROPIONATE 50 MCG/ACT NA SUSP: 2.0000 | Freq: Every day | NASAL | 0 refills | Status: DC

## 2017-08-22 MED ORDER — MELOXICAM 7.5 MG PO TABS
7.5000 mg | ORAL_TABLET | Freq: Every day | ORAL | 0 refills | Status: DC
Start: 2017-08-22 — End: 2018-11-21

## 2017-08-22 MED ORDER — METRONIDAZOLE 500 MG PO TABS: 500.0000 mg | ORAL_TABLET | Freq: Two times a day (BID) | ORAL | 0 refills | Status: DC

## 2017-08-22 NOTE — Discharge Instructions (Signed)
Start Mobic for hreadache. Do not take ibuprofen (motrin/advil)/ naproxen (aleve) while on mobic. Start flonase, atrovent nasal spray for nasal congestion/drainage. You can use over the counter nasal saline rinse such as neti pot for nasal congestion. Keep hydrated, your urine should be clear to pale yellow in color. Tylenol/motrin for fever and pain. Monitor for any worsening of symptoms, chest pain, shortness of breath, wheezing, swelling of the throat, follow up for reevaluation.   You were treated empirically for bacterial vaginitis. Cytology sent, you will be contacted with any positive results that requires further treatment. Refrain from sexual activity and alcohol use for the next 7 days. Monitor for any worsening of symptoms, fever, abdominal pain, nausea, vomiting, to follow up for reevaluation.

## 2017-08-22 NOTE — ED Notes (Signed)
Yolanda Norris obtained cervicovaginal swab

## 2017-08-22 NOTE — ED Provider Notes (Signed)
[facility]    CSN: [id number] Arrival date & time: 08/22/17  0845     History   Chief Complaint Chief Complaint  Patient presents with  . Headache  . urinary odor    HPI Lanier Ensignracey Turner White is a 47 y.o. female.   47 year old female comes in for multiple complaints.  2-day history of headache.  States headache is frontal, throbbing in sensation.  States yesterday, had some photophobia and phonophobia that has now resolved.  Denies nausea, vomiting.  Does have seasonal allergies, and continues with mild rhinorrhea, nasal congestion.  Denies cough, sore throat.  Denies fever, chills, night sweats.  Denies weakness, dizziness, syncope.  Has not taken anything for the symptoms.  States has had dark urine with ammonia smell for the past week.  States main complaint is odor that is present even without urination.  States in the past has been treated for BV when this occurs with good improvement on Flagyl.  States this smell has been coming and going for the past many years.  This current episode started a week ago.  Denies urinary frequency, dysuria, hematuria.  Denies abdominal pain, nausea, vomiting.  Denies vaginal discharge, itching, irritation.  Denies hygiene product change.  Not currently sexually active, last sexual activity a few years ago.  LMP 08/21/2017.     Past Medical History:  Diagnosis Date  . Anxiety     There are no active problems to display for this patient.   Past Surgical History:  Procedure Laterality Date  . ANKLE SURGERY    . BELPHAROPTOSIS REPAIR    . SKIN GRAFT      OB History   None      Home Medications    Prior to Admission medications   Medication Sig Start Date End Date Taking? Authorizing Provider  albuterol (PROVENTIL HFA;VENTOLIN HFA) 108 (90 Base) MCG/ACT inhaler Inhale 2 puffs into the lungs every 4 (four) hours as needed for wheezing or shortness of breath. 05/22/17  Yes Wallis BambergMani, Mario, PA-C  cetirizine (ZYRTEC ALLERGY) 10  MG tablet Take 1 tablet (10 mg total) by mouth daily. 05/22/17  Yes Wallis BambergMani, Mario, PA-C  loratadine (CLARITIN) 10 MG tablet Take 1 tablet (10 mg total) by mouth daily. 05/22/17  Yes Wallis BambergMani, Mario, PA-C  montelukast (SINGULAIR) 10 MG tablet Take 1 tablet (10 mg total) by mouth at bedtime. 05/22/17  Yes Wallis BambergMani, Mario, PA-C  fluticasone (FLONASE) 50 MCG/ACT nasal spray Place 2 sprays into both nostrils daily. 08/22/17   Cathie HoopsYu, Tamim Skog V, PA-C  ipratropium (ATROVENT) 0.06 % nasal spray Place 2 sprays into both nostrils 4 (four) times daily. 08/22/17   Cathie HoopsYu, Weylyn Ricciuti V, PA-C  meloxicam (MOBIC) 7.5 MG tablet Take 1 tablet (7.5 mg total) by mouth daily. 08/22/17   Cathie HoopsYu, Thorsten Climer V, PA-C  metroNIDAZOLE (FLAGYL) 500 MG tablet Take 1 tablet (500 mg total) by mouth 2 (two) times daily. 08/22/17   Belinda FisherYu, Josetta Wigal V, PA-C    Family History Family History  Problem Relation Age of Onset  . Diabetes Father     Social History Social History   Tobacco Use  . Smoking status: Current Some Day Smoker    Packs/day: 0.50    Types: Cigarettes  . Smokeless tobacco: Never Used  Substance Use Topics  . Alcohol use: No  . Drug use: No     Allergies   Patient has no known allergies.   Review of Systems Review of Systems  Reason unable to perform ROS: See HPI  as above.     Physical Exam Triage Vital Signs ED Triage Vitals  Enc Vitals Group     BP 08/22/17 0913 114/73     Pulse Rate 08/22/17 0913 80     Resp 08/22/17 0913 16     Temp 08/22/17 0913 98.4 F (36.9 C)     Temp Source 08/22/17 0913 Oral     SpO2 08/22/17 0913 100 %     Weight --      Height --      Head Circumference --      Peak Flow --      Pain Score 08/22/17 0918 5     Pain Loc --      Pain Edu? --      Excl. in GC? --    No data found.  Updated Vital Signs BP 114/73 (BP Location: Right Arm)   Pulse 80   Temp 98.4 F (36.9 C) (Oral)   Resp 16   LMP 08/21/2017 (Exact Date)   SpO2 100%   Physical Exam  Constitutional: She is oriented to person, place,  and time. She appears well-developed and well-nourished.  Non-toxic appearance. She does not appear ill. No distress.  HENT:  Head: Normocephalic and atraumatic.  Right Ear: Tympanic membrane, external ear and ear canal normal. Tympanic membrane is not erythematous and not bulging.  Left Ear: Tympanic membrane, external ear and ear canal normal. Tympanic membrane is not erythematous and not bulging.  Nose: Rhinorrhea present. Right sinus exhibits frontal sinus tenderness. Right sinus exhibits no maxillary sinus tenderness. Left sinus exhibits frontal sinus tenderness. Left sinus exhibits no maxillary sinus tenderness.  Mouth/Throat: Uvula is midline, oropharynx is clear and moist and mucous membranes are normal.  Eyes: Pupils are equal, round, and reactive to light. Conjunctivae are normal.  Neck: Normal range of motion. Neck supple.  Cardiovascular: Normal rate, regular rhythm and normal heart sounds. Exam reveals no gallop and no friction rub.  No murmur heard. Pulmonary/Chest: Effort normal and breath sounds normal. No stridor. No respiratory distress. She has no decreased breath sounds. She has no wheezes. She has no rhonchi. She has no rales.  Abdominal: Soft. Bowel sounds are normal. She exhibits no mass. There is no tenderness. There is no rebound, no guarding and no CVA tenderness.  Lymphadenopathy:    She has no cervical adenopathy.  Neurological: She is alert and oriented to person, place, and time. She has normal strength. She is not disoriented. Coordination and gait normal. GCS eye subscore is 4. GCS verbal subscore is 5. GCS motor subscore is 6.  Able to ambulate on own without difficulty.   Skin: Skin is warm and dry.  Psychiatric: She has a normal mood and affect. Her behavior is normal. Judgment normal.     UC Treatments / Results  Labs (all labs ordered are listed, but only abnormal results are displayed) Labs Reviewed  POCT URINALYSIS DIP (DEVICE) - Abnormal; Notable for  the following components:      Result Value   Bilirubin Urine SMALL (*)    Hgb urine dipstick MODERATE (*)    All other components within normal limits  CERVICOVAGINAL ANCILLARY ONLY    EKG None  Radiology No results found.  Procedures Procedures (including critical care time)  Medications Ordered in UC Medications - No data to display  Initial Impression / Assessment and Plan / UC Course  I have reviewed the triage vital signs and the nursing notes.  Pertinent labs & imaging  results that were available during my care of the patient were reviewed by me and considered in my medical decision making (see chart for details).    Discussed with patient history and exam most consistent with allergic rhinitis vs viral URI. This could be the cause of headache. Symptomatic treatment as needed. Push fluids. Return precautions given.   Patient was treated empirically for BV. Flagyl as directed. Cytology sent, patient will be contacted with any positive results that require additional treatment. Patient to refrain from sexual activity for the next 7 days. Return precautions given.    Final Clinical Impressions(s) / UC Diagnoses   Final diagnoses:  Non-seasonal allergic rhinitis due to other allergic trigger  Vaginal odor    ED Prescriptions    Medication Sig Dispense Auth. Provider   fluticasone (FLONASE) 50 MCG/ACT nasal spray Place 2 sprays into both nostrils daily. 1 g Bexley Laubach V, PA-C   ipratropium (ATROVENT) 0.06 % nasal spray Place 2 sprays into both nostrils 4 (four) times daily. 15 mL Quintrell Baze V, PA-C   meloxicam (MOBIC) 7.5 MG tablet Take 1 tablet (7.5 mg total) by mouth daily. 15 tablet Atthew Coutant V, PA-C   metroNIDAZOLE (FLAGYL) 500 MG tablet Take 1 tablet (500 mg total) by mouth 2 (two) times daily. 14 tablet Threasa Alpha, [place] 08/22/17 1020

## 2017-08-22 NOTE — ED Triage Notes (Signed)
Pt reports a headache that started yesterday.  She has not taken anything for it.  Pt also reports dark urine with an ammonia smell to it for the last week.

## 2017-08-23 LAB — CERVICOVAGINAL ANCILLARY ONLY
BACTERIAL VAGINITIS: POSITIVE — AB
Candida vaginitis: NEGATIVE
Chlamydia: NEGATIVE
NEISSERIA GONORRHEA: NEGATIVE
Trichomonas: NEGATIVE

## 2017-08-25 ENCOUNTER — Telehealth (HOSPITAL_COMMUNITY): Payer: Self-pay

## 2017-08-25 NOTE — Telephone Encounter (Signed)
Bacterial Vaginosis test is positive.  Prescription for metronidazole was given at the urgent care visit. Pt contacted regarding results. Answered all questions. Verbalized understanding.   

## 2017-08-31 ENCOUNTER — Other Ambulatory Visit: Payer: Self-pay | Admitting: Urgent Care

## 2018-01-22 ENCOUNTER — Other Ambulatory Visit (HOSPITAL_COMMUNITY): Payer: Self-pay | Admitting: Urgent Care

## 2018-04-11 ENCOUNTER — Other Ambulatory Visit (HOSPITAL_COMMUNITY): Payer: Self-pay | Admitting: Urgent Care

## 2018-07-04 ENCOUNTER — Encounter (HOSPITAL_COMMUNITY): Payer: Self-pay

## 2018-07-04 ENCOUNTER — Other Ambulatory Visit: Payer: Self-pay

## 2018-07-04 ENCOUNTER — Ambulatory Visit (HOSPITAL_COMMUNITY)
Admission: EM | Admit: 2018-07-04 | Discharge: 2018-07-04 | Disposition: A | Discharge status: Home / Self Care | Payer: 59 | Attending: Family Medicine | Admitting: Family Medicine

## 2018-07-04 DIAGNOSIS — N760 Acute vaginitis: Secondary | ICD-10-CM | POA: Insufficient documentation

## 2018-07-04 DIAGNOSIS — H8110 Benign paroxysmal vertigo, unspecified ear: Secondary | ICD-10-CM

## 2018-07-04 DIAGNOSIS — J4520 Mild intermittent asthma, uncomplicated: Secondary | ICD-10-CM

## 2018-07-04 LAB — POCT URINALYSIS DIP (DEVICE)
Bilirubin Urine: NEGATIVE
Glucose, UA: NEGATIVE mg/dL
Hgb urine dipstick: NEGATIVE
Ketones, ur: NEGATIVE mg/dL
Leukocytes,Ua: NEGATIVE
Nitrite: NEGATIVE
Protein, ur: NEGATIVE mg/dL
Specific Gravity, Urine: >=1.03 (ref 1.005–1.030)
Urobilinogen, UA: 1 mg/dL (ref 0.0–1.0)
pH: 6.5 (ref 5.0–8.0)

## 2018-07-04 MED ORDER — MECLIZINE HCL 12.5 MG PO TABS: 12.5000 mg | ORAL_TABLET | Freq: Three times a day (TID) | ORAL | 0 refills | Status: DC | PRN

## 2018-07-04 MED ORDER — METRONIDAZOLE 500 MG PO TABS: 500.0000 mg | ORAL_TABLET | Freq: Two times a day (BID) | ORAL | 0 refills | Status: AC

## 2018-07-04 MED ORDER — ALBUTEROL SULFATE HFA 108 (90 BASE) MCG/ACT IN AERS: 1.0000 | INHALATION_SPRAY | Freq: Four times a day (QID) | RESPIRATORY_TRACT | 0 refills | Status: DC | PRN

## 2018-07-04 NOTE — ED Provider Notes (Signed)
[facility]    CSN: [id number] Arrival date & time: 07/04/18  0803     History   Chief Complaint Chief Complaint  Patient presents with  . Medication Refill  . Vaginal Discharge    HPI Yolanda Norris is a 48 y.o. female no contributing past medical history presenting today for evaluation of multiple complaints including medication refill, vaginal irritation as well as vertigo.  Patient states that she has been out of her albuterol inhaler for the past 2 weeks.  She mainly has nighttime symptoms of wheezing, occasional increased shortness of breath with exertion.  Denies any cough, fevers chills or body aches.  Denies chest pain.  Denies current symptoms of her asthma during visit.  She has also noticed that recently she is developed malodorous discharge, has felt dry and irritated.  She is also noticed her urine color becoming darker.  Denies any dysuria or increased frequency.  Has had some mild incontinence.  She believes she is perimenopausal which has attributed to some of the symptoms.  Her symptoms also feel very similar to when she last had BV.  Denies abdominal pain, nausea or vomiting.  Menstrual cycles have been irregular recently.  She also notes that she has had more frequent episodes of her vertigo recently.  She is attributed this to her increase in weight.  She denies any recent URI or respiratory symptoms.  She states that her symptoms will last for approximately 5 minutes and subside.  She has approximately 1-2 episodes per week.  Has previously used meclizine, but this medicine has expired.  Denies headaches.  Occasional ringing in ears.  Has never seen ENT for vertigo.  HPI  Past Medical History:  Diagnosis Date  . Anxiety     There are no active problems to display for this patient.   Past Surgical History:  Procedure Laterality Date  . ANKLE SURGERY    . BELPHAROPTOSIS REPAIR    . SKIN GRAFT      OB History   No obstetric history on file.       Home Medications    Prior to Admission medications   Medication Sig Start Date End Date Taking? Authorizing Provider  albuterol (VENTOLIN HFA) 108 (90 Base) MCG/ACT inhaler Inhale 1-2 puffs into the lungs every 6 (six) hours as needed for wheezing or shortness of breath. 07/04/18   ,  C, PA-C  cetirizine (ZYRTEC ALLERGY) 10 MG tablet Take 1 tablet (10 mg total) by mouth daily. 05/22/17   Wallis BambergMani, Mario, PA-C  fluticasone (FLONASE) 50 MCG/ACT nasal spray Place 2 sprays into both nostrils daily. 08/22/17   Cathie HoopsYu, Amy V, PA-C  ipratropium (ATROVENT) 0.06 % nasal spray Place 2 sprays into both nostrils 4 (four) times daily. 08/22/17   Cathie HoopsYu, Amy V, PA-C  loratadine (CLARITIN) 10 MG tablet Take 1 tablet (10 mg total) by mouth daily. 05/22/17   Wallis BambergMani, Mario, PA-C  meclizine (ANTIVERT) 12.5 MG tablet Take 1 tablet (12.5 mg total) by mouth 3 (three) times daily as needed for dizziness. Do not drive/work after taking 8/65/785/26/20   ,  C, PA-C  meloxicam (MOBIC) 7.5 MG tablet Take 1 tablet (7.5 mg total) by mouth daily. 08/22/17   Cathie HoopsYu, Amy V, PA-C  metroNIDAZOLE (FLAGYL) 500 MG tablet Take 1 tablet (500 mg total) by mouth 2 (two) times daily for 7 days. 07/04/18 07/11/18  ,  C, PA-C  montelukast (SINGULAIR) 10 MG tablet Take 1 tablet (10 mg total) by mouth at bedtime.  05/22/17   Wallis Bamberg, PA-C    Family History Family History  Problem Relation Age of Onset  . Diabetes Father     Social History Social History   Tobacco Use  . Smoking status: Current Some Day Smoker    Packs/day: 0.50    Types: Cigarettes  . Smokeless tobacco: Never Used  Substance Use Topics  . Alcohol use: No  . Drug use: No     Allergies   Patient has no known allergies.   Review of Systems Review of Systems  Constitutional: Negative for activity change, appetite change, chills, fatigue and fever.  HENT: Negative for congestion, ear pain, rhinorrhea, sinus pressure, sore throat and trouble  swallowing.   Eyes: Negative for photophobia, pain, discharge, redness and visual disturbance.  Respiratory: Negative for cough, chest tightness and shortness of breath.   Cardiovascular: Negative for chest pain.  Gastrointestinal: Negative for abdominal pain, diarrhea, nausea and vomiting.  Genitourinary: Positive for vaginal discharge. Negative for decreased urine volume, dysuria, flank pain, menstrual problem, vaginal bleeding and vaginal pain.  Musculoskeletal: Negative for back pain, myalgias, neck pain and neck stiffness.  Skin: Negative for rash.  Neurological: Positive for dizziness. Negative for syncope, facial asymmetry, speech difficulty, weakness, light-headedness, numbness and headaches.     Physical Exam Triage Vital Signs ED Triage Vitals  Enc Vitals Group     BP 07/04/18 0817 117/74     Pulse Rate 07/04/18 0817 76     Resp 07/04/18 0817 18     Temp 07/04/18 0817 98.5 F (36.9 C)     Temp Source 07/04/18 0817 Oral     SpO2 07/04/18 0817 100 %     Weight --      Height --      Head Circumference --      Peak Flow --      Pain Score 07/04/18 0815 0     Pain Loc --      Pain Edu? --      Excl. in GC? --    No data found.  Updated Vital Signs BP 117/74 (BP Location: Right Arm)   Pulse 76   Temp 98.5 F (36.9 C) (Oral)   Resp 18   SpO2 100%   Visual Acuity Right Eye Distance:   Left Eye Distance:   Bilateral Distance:    Right Eye Near:   Left Eye Near:    Bilateral Near:     Physical Exam Vitals signs and nursing note reviewed.  Constitutional:      General: She is not in acute distress.    Appearance: She is well-developed.  HENT:     Head: Normocephalic and atraumatic.     Ears:     Comments: Bilateral ears without tenderness to palpation of external auricle, tragus and mastoid, EAC's without erythema or swelling, TM's with good bony landmarks and cone of light. Non erythematous.    Mouth/Throat:     Comments: Oral mucosa pink and moist, no  tonsillar enlargement or exudate. Posterior pharynx patent and nonerythematous, no uvula deviation or swelling. Normal phonation.  Eyes:     Extraocular Movements: Extraocular movements intact.     Conjunctiva/sclera: Conjunctivae normal.     Pupils: Pupils are equal, round, and reactive to light.  Neck:     Musculoskeletal: Normal range of motion and neck supple.  Cardiovascular:     Rate and Rhythm: Normal rate and regular rhythm.     Heart sounds: No murmur.  Pulmonary:  Effort: Pulmonary effort is normal. No respiratory distress.     Breath sounds: Normal breath sounds.     Comments: Breathing comfortably at rest, CTABL, no wheezing, rales or other adventitious sounds auscultated Abdominal:     Palpations: Abdomen is soft.     Tenderness: There is no abdominal tenderness.     Comments: Soft, nondistended, nontender light palpation throughout abdomen  Genitourinary:    Comments: Deferred Skin:    General: Skin is warm and dry.  Neurological:     General: No focal deficit present.     Mental Status: She is alert and oriented to person, place, and time. Mental status is at baseline.     Cranial Nerves: No cranial nerve deficit.     Gait: Gait normal.      UC Treatments / Results  Labs (all labs ordered are listed, but only abnormal results are displayed) Labs Reviewed  POCT URINALYSIS DIP (DEVICE)  CERVICOVAGINAL ANCILLARY ONLY    EKG None  Radiology No results found.  Procedures Procedures (including critical care time)  Medications Ordered in UC Medications - No data to display  Initial Impression / Assessment and Plan / UC Course  I have reviewed the triage vital signs and the nursing notes.  Pertinent labs & imaging results that were available during my care of the patient were reviewed by me and considered in my medical decision making (see chart for details).     Refilled albuterol inhaler, lungs clear on exam, vital signs stable without hypoxia or  tachycardia, no respiratory distress.  Will defer steroids at this time.  Last visit in July 2019 she had similar symptoms and was positive for BV.  Will refill metronidazole and treat for BV empirically today.  Also discussed dryness does become more common with menopause.  Vaginal swab obtained, will send off to confirm for causes of discharge/odor.  Vertigo, infrequent.  Refilled meclizine to use as needed, also discussed use of Epley maneuver to decrease frequency and intensity.  No neuro deficit on exam.  Discussed strict return precautions. Patient verbalized understanding and is agreeable with plan.  Final Clinical Impressions(s) / UC Diagnoses   Final diagnoses:  Mild intermittent asthma without complication  Acute vaginitis  Benign paroxysmal positional vertigo, unspecified laterality     Discharge Instructions     Asthma I have refilled your inhaler continue to use as needed, follow up if breathing, shortness of breath not fully controlled with inhaler Vertigo Use meclizine as needed Try to perform Epley maneuver at home to decrease frequency and intensity of symptoms BV Begin metronidazole twice daily for 1 week. No alcohol until 24 hours after last tablet.  We will call with results of swab May try small amount of coconut oil vaginally at bedtime to help with dryness    ED Prescriptions    Medication Sig Dispense Auth. Provider   albuterol (VENTOLIN HFA) 108 (90 Base) MCG/ACT inhaler Inhale 1-2 puffs into the lungs every 6 (six) hours as needed for wheezing or shortness of breath. 1 Inhaler ,  C, PA-C   metroNIDAZOLE (FLAGYL) 500 MG tablet Take 1 tablet (500 mg total) by mouth 2 (two) times daily for 7 days. 14 tablet ,  C, PA-C   meclizine (ANTIVERT) 12.5 MG tablet Take 1 tablet (12.5 mg total) by mouth 3 (three) times daily as needed for dizziness. Do not drive/work after taking 30 tablet ,  C, PA-C     Controlled Substance  Prescriptions [place] Controlled Substance Registry  consulted? Not Applicable   Lew Dawes, [place] 07/04/18 [phone]

## 2018-07-04 NOTE — ED Triage Notes (Signed)
Patient presents to Urgent Care with complaints of needing a refill on her inhaler since 2 weeks ago. Patient reports she has not been able to get in with her PCP. Pt also states she has odorous discharge similar to last time with a dry, itchy vagina; pt thinks she is pre-menopausal.

## 2018-07-04 NOTE — Discharge Instructions (Signed)
Asthma I have refilled your inhaler continue to use as needed, follow up if breathing, shortness of breath not fully controlled with inhaler Vertigo Use meclizine as needed Try to perform Epley maneuver at home to decrease frequency and intensity of symptoms BV Begin metronidazole twice daily for 1 week. No alcohol until 24 hours after last tablet.  We will call with results of swab May try small amount of coconut oil vaginally at bedtime to help with dryness

## 2018-07-05 LAB — CERVICOVAGINAL ANCILLARY ONLY
Bacterial vaginitis: POSITIVE — AB
Candida vaginitis: NEGATIVE
Chlamydia: NEGATIVE
Neisseria Gonorrhea: NEGATIVE
Trichomonas: NEGATIVE

## 2018-07-07 ENCOUNTER — Telehealth (HOSPITAL_COMMUNITY): Payer: Self-pay | Admitting: Emergency Medicine

## 2018-07-07 NOTE — Telephone Encounter (Signed)
Bacterial Vaginosis test is positive.  Prescription for metronidazole was given at the urgent care visit.  

## 2018-07-18 ENCOUNTER — Telehealth: Payer: Self-pay | Admitting: Family Medicine

## 2018-07-18 NOTE — Telephone Encounter (Signed)
Called and spoke to patient about upcoming appt, she has been instructed to wear a face mask of some sort, also no visitors are not allowed at this time due to [place], I had patient if she had any symptoms and she said NO.  Patient was sent the link for Mychart and she will download the app.

## 2018-07-19 ENCOUNTER — Encounter: Payer: Self-pay | Admitting: Obstetrics and Gynecology

## 2018-07-19 ENCOUNTER — Ambulatory Visit (INDEPENDENT_AMBULATORY_CARE_PROVIDER_SITE_OTHER): Payer: 59 | Admitting: Obstetrics and Gynecology

## 2018-07-19 ENCOUNTER — Other Ambulatory Visit: Payer: Self-pay

## 2018-07-19 VITALS — BP 122/76 | HR 80 | Wt 149.0 lb

## 2018-07-19 DIAGNOSIS — Z1239 Encounter for other screening for malignant neoplasm of breast: Secondary | ICD-10-CM | POA: Diagnosis not present

## 2018-07-19 DIAGNOSIS — N951 Menopausal and female climacteric states: Secondary | ICD-10-CM | POA: Diagnosis not present

## 2018-07-19 DIAGNOSIS — Z3009 Encounter for other general counseling and advice on contraception: Secondary | ICD-10-CM

## 2018-07-19 DIAGNOSIS — Z124 Encounter for screening for malignant neoplasm of cervix: Secondary | ICD-10-CM

## 2018-07-19 NOTE — Progress Notes (Signed)
GYNECOLOGY OFFICE VISIT NOTE  History:  48 y.o. G2P0202 here today for contraception. She has not been sexually active in the last few years but is now and wants to prevent pregnancy. States she knows it would be unlikely but still wants a method of contraception. Has been doing research and thinks she would like an IUD.   Also wondering if she is starting menopause. Has generally always had regular periods. The last 6 years, they were regular, then a few months ago, she started having irregular starts to periods. Periods would last her usual 3-5 days, moderate, but would start irregularly. Last month, did not have a period. She has also started hot flushes as well, last 3-4 weeks.   Past Medical History:  Diagnosis Date  . Anxiety     Past Surgical History:  Procedure Laterality Date  . ANKLE SURGERY    . BELPHAROPTOSIS REPAIR    . SKIN GRAFT       Current Outpatient Medications:  .  albuterol (VENTOLIN HFA) 108 (90 Base) MCG/ACT inhaler, Inhale 1-2 puffs into the lungs every 6 (six) hours as needed for wheezing or shortness of breath., Disp: 1 Inhaler, Rfl: 0 .  cetirizine (ZYRTEC ALLERGY) 10 MG tablet, Take 1 tablet (10 mg total) by mouth daily., Disp: 90 tablet, Rfl: 1 .  fluticasone (FLONASE) 50 MCG/ACT nasal spray, Place 2 sprays into both nostrils daily., Disp: 1 g, Rfl: 0 .  ipratropium (ATROVENT) 0.06 % nasal spray, Place 2 sprays into both nostrils 4 (four) times daily., Disp: 15 mL, Rfl: 0 .  loratadine (CLARITIN) 10 MG tablet, Take 1 tablet (10 mg total) by mouth daily., Disp: 90 tablet, Rfl: 1 .  meclizine (ANTIVERT) 12.5 MG tablet, Take 1 tablet (12.5 mg total) by mouth 3 (three) times daily as needed for dizziness. Do not drive/work after taking, Disp: 30 tablet, Rfl: 0 .  meloxicam (MOBIC) 7.5 MG tablet, Take 1 tablet (7.5 mg total) by mouth daily., Disp: 15 tablet, Rfl: 0 .  montelukast (SINGULAIR) 10 MG tablet, Take 1 tablet (10 mg total) by mouth at bedtime., Disp:  90 tablet, Rfl: 0  The following portions of the patient's history were reviewed and updated as appropriate: allergies, current medications, past family history, past medical history, past social history, past surgical history and problem list.   Health Maintenance:  Last pap: due Last mammogram: needs  Review of Systems:  Pertinent items noted in HPI and remainder of comprehensive ROS otherwise negative.   Objective:  Physical Exam BP 122/76   Pulse 80   Wt 149 lb (67.6 kg)   BMI 26.39 kg/m  CONSTITUTIONAL: Well-developed, well-nourished female in no acute distress.  HENT:  Normocephalic, atraumatic. External right and left ear normal. Oropharynx is clear and moist EYES: Conjunctivae and EOM are normal. Pupils are equal, round, and reactive to light. No scleral icterus.  NECK: Normal range of motion, supple, no masses SKIN: Skin is warm and dry. No rash noted. Not diaphoretic. No erythema. No pallor. NEUROLOGIC: Alert and oriented to person, place, and time. Normal reflexes, muscle tone coordination. No cranial nerve deficit noted. PSYCHIATRIC: Normal mood and affect. Normal behavior. Normal judgment and thought content. CARDIOVASCULAR: Normal heart rate noted RESPIRATORY: Effort normal, no problems with respiration noted ABDOMEN:  no distention noted.   PELVIC:deferred MUSCULOSKELETAL: Normal range of motion. No edema noted.  Labs and Imaging No results found.  Assessment & Plan:   1. Counseling for birth control regarding intrauterine device (IUD) Reviewed  that it would be very unlikely for her to become pregnant at the age of 48, however, as long as she is getting periods, it is not impossible. Reviewed IUD would be reasonable option for contraception, but as the LngIUDs tend to make periods lighter, this may mask menopause as she may become amenorrheic from the IUD or menopause. Will obtain US prior to placement, she is agreeable to this plan - US PELVIC COMPLETE WITH  TRANSVAGINAL; Future - MM 3D SCREEN BREAST BILATERAL; Future  2. Hot flushes, perimenopausal Reviewed this is symptom of menopause, reviewed strategies for dealing with it, that she may need medication at some point  3. Perimenopause Reviewed it is likely she is starting perimenopause with abnormal periods, but that menopause is 1 yr without periods and there is no definitive test, she verbalizes understanding  4. Pap smear for cervical cancer screening Will do pap when patient returns for IUD placement  5. Breast cancer screening Mammogram ordered   Routine preventative health maintenance measures emphasized. Please refer to After Visit Summary for other counseling recommendations.   Return in about 4 weeks (around 08/16/2018) for Followup.   Total face-to-face time with patient: 20 minutes. Over 50% of encounter was spent on counseling and coordination of care.   Baldemar LenisK. Meryl Davis, M.D. Attending Center for [facility](Faculty Practice)

## 2018-07-20 ENCOUNTER — Encounter: Payer: Self-pay | Admitting: Obstetrics and Gynecology

## 2018-07-31 ENCOUNTER — Other Ambulatory Visit: Payer: Self-pay

## 2018-07-31 ENCOUNTER — Ambulatory Visit (HOSPITAL_COMMUNITY)
Admission: RE | Admit: 2018-07-31 | Discharge: 2018-07-31 | Disposition: A | Discharge status: Home / Self Care | Payer: 59 | Source: Ambulatory Visit | Attending: Obstetrics and Gynecology | Admitting: Obstetrics and Gynecology

## 2018-07-31 DIAGNOSIS — Z3009 Encounter for other general counseling and advice on contraception: Secondary | ICD-10-CM | POA: Insufficient documentation

## 2018-08-19 ENCOUNTER — Ambulatory Visit (HOSPITAL_COMMUNITY)
Admission: EM | Admit: 2018-08-19 | Discharge: 2018-08-19 | Disposition: A | Discharge status: Home / Self Care | Payer: 59 | Attending: Physician Assistant | Admitting: Physician Assistant

## 2018-08-19 ENCOUNTER — Encounter (HOSPITAL_COMMUNITY): Payer: Self-pay | Admitting: Emergency Medicine

## 2018-08-19 ENCOUNTER — Other Ambulatory Visit: Payer: Self-pay

## 2018-08-19 DIAGNOSIS — H00014 Hordeolum externum left upper eyelid: Secondary | ICD-10-CM

## 2018-08-19 MED ORDER — ERYTHROMYCIN 5 MG/GM OP OINT: TOPICAL_OINTMENT | OPHTHALMIC | 0 refills | Status: DC

## 2018-08-19 NOTE — ED Triage Notes (Signed)
Stye/chalazion to left upper lid.  First noticed bump on Sunday evening.  This morning bump had started draining.   Patient needs a work note.

## 2018-08-19 NOTE — Discharge Instructions (Signed)
Start erythromycin as directed. Lid scrubs and warm compresses as directed. Monitor for any worsening of symptoms, changes in vision, sensitivity to light, eye swelling, painful eye movement, follow up with ophthalmology for further evaluation.

## 2018-08-19 NOTE — ED Provider Notes (Signed)
[place]    CSN: [id number] Arrival date & time: 08/19/18  1013     History   Chief Complaint Chief Complaint  Patient presents with  . Eye Problem    HPI Yolanda Norris is a 48 y.o. female.   48 year old female comes in for 1 week history of swelling to the left upper lid.  States this morning area started draining and came in for evaluation.  Area was tender prior to drainage.  With mild erythema without warmth.  Denies spreading erythema.  Denies vision changes, photophobia.  Denies conjunctival injection.  Denies drainage to the eyes.  She wears reading glasses, has not worn contacts for the past 10 years.  She has not been putting on make-up for the past 2 months.  Does have some watery/itchy eyes that she takes over-the-counter antihistamine for.      Past Medical History:  Diagnosis Date  . Anxiety     There are no active problems to display for this patient.   Past Surgical History:  Procedure Laterality Date  . ANKLE SURGERY    . BELPHAROPTOSIS REPAIR    . SKIN GRAFT      OB History   No obstetric history on file.      Home Medications    Prior to Admission medications   Medication Sig Start Date End Date Taking? Authorizing Provider  loratadine (CLARITIN) 10 MG tablet Take 1 tablet (10 mg total) by mouth daily. 05/22/17  Yes Jaynee Eagles, PA-C  montelukast (SINGULAIR) 10 MG tablet Take 1 tablet (10 mg total) by mouth at bedtime. 05/22/17  Yes Jaynee Eagles, PA-C  albuterol (VENTOLIN HFA) 108 (90 Base) MCG/ACT inhaler Inhale 1-2 puffs into the lungs every 6 (six) hours as needed for wheezing or shortness of breath. 07/04/18   Wieters, Hallie C, PA-C  erythromycin ophthalmic ointment Place a 1/2 inch ribbon of ointment into the lower eyelid 4 times a day for 7 days. 08/19/18   Tasia Catchings, Christie Copley V, PA-C  fluticasone (FLONASE) 50 MCG/ACT nasal spray Place 2 sprays into both nostrils daily. 08/22/17   Tasia Catchings, Maeby Vankleeck V, PA-C  ipratropium (ATROVENT) 0.06 % nasal  spray Place 2 sprays into both nostrils 4 (four) times daily. 08/22/17   Ok Edwards, PA-C  meclizine (ANTIVERT) 12.5 MG tablet Take 1 tablet (12.5 mg total) by mouth 3 (three) times daily as needed for dizziness. Do not drive/work after taking 07/04/18   Wieters, Hallie C, PA-C  meloxicam (MOBIC) 7.5 MG tablet Take 1 tablet (7.5 mg total) by mouth daily. 08/22/17   Tasia Catchings, Espen Bethel V, PA-C  cetirizine (ZYRTEC ALLERGY) 10 MG tablet Take 1 tablet (10 mg total) by mouth daily. 05/22/17 08/19/18  Jaynee Eagles, PA-C    Family History Family History  Problem Relation Age of Onset  . Diabetes Father     Social History Social History   Tobacco Use  . Smoking status: Current Some Day Smoker    Packs/day: 0.50    Types: Cigarettes  . Smokeless tobacco: Never Used  Substance Use Topics  . Alcohol use: No  . Drug use: No     Allergies   Patient has no known allergies.   Review of Systems Review of Systems  Reason unable to perform ROS: See HPI as above.     Physical Exam Triage Vital Signs ED Triage Vitals  Enc Vitals Group     BP 08/19/18 1036 130/85     Pulse Rate 08/19/18 1036 84  Resp 08/19/18 1036 16     Temp 08/19/18 1036 97.8 F (36.6 C)     Temp Source 08/19/18 1036 Oral     SpO2 08/19/18 1036 98 %     Weight --      Height --      Head Circumference --      Peak Flow --      Pain Score 08/19/18 1046 3     Pain Loc --      Pain Edu? --      Excl. in GC? --    No data found.  Updated Vital Signs BP 130/85 (BP Location: Left Arm)   Pulse 84   Temp 97.8 F (36.6 C) (Oral)   Resp 16   LMP 08/19/2018   SpO2 98%   Visual Acuity Right Eye Distance:   Left Eye Distance:   Bilateral Distance:    Right Eye Near:   Left Eye Near:    Bilateral Near:     Physical Exam Constitutional:      General: She is not in acute distress.    Appearance: She is well-developed. She is not ill-appearing, toxic-appearing or diaphoretic.  HENT:     Head: Normocephalic and  atraumatic.  Eyes:     Extraocular Movements: Extraocular movements intact.     Conjunctiva/sclera: Conjunctivae normal.     Pupils: Pupils are equal, round, and reactive to light.     Comments: Hordeolum to the left upper eyelid laterally with open wound from drainage.  No drainage seen at this time.  No surrounding cellulitis.  Neurological:     Mental Status: She is alert and oriented to person, place, and time.      UC Treatments / Results  Labs (all labs ordered are listed, but only abnormal results are displayed) Labs Reviewed - No data to display  EKG   Radiology No results found.  Procedures Procedures (including critical care time)  Medications Ordered in UC Medications - No data to display  Initial Impression / Assessment and Plan / UC Course  I have reviewed the triage vital signs and the nursing notes.  Pertinent labs & imaging results that were available during my care of the patient were reviewed by me and considered in my medical decision making (see chart for details).  Start erythromycin. Lid scrubs and warm compresses as directed. Patient to follow up with ophthalmology if symptoms worsens or does not improve. Return precautions given.   Final Clinical Impressions(s) / UC Diagnoses   Final diagnoses:  Hordeolum externum of left upper eyelid   ED Prescriptions    Medication Sig Dispense Auth. Provider   erythromycin ophthalmic ointment Place a 1/2 inch ribbon of ointment into the lower eyelid 4 times a day for 7 days. 1 g Threasa AlphaYu, Chief Walkup V, PA-C        Shakeerah Gradel V, [place] 08/19/18 1111

## 2018-08-23 ENCOUNTER — Ambulatory Visit (INDEPENDENT_AMBULATORY_CARE_PROVIDER_SITE_OTHER): Payer: 59 | Admitting: Obstetrics and Gynecology

## 2018-08-23 ENCOUNTER — Encounter: Payer: Self-pay | Admitting: Obstetrics and Gynecology

## 2018-08-23 ENCOUNTER — Other Ambulatory Visit: Payer: Self-pay

## 2018-08-23 VITALS — BP 106/72 | HR 80 | Wt 144.0 lb

## 2018-08-23 DIAGNOSIS — Z30430 Encounter for insertion of intrauterine contraceptive device: Secondary | ICD-10-CM | POA: Diagnosis not present

## 2018-08-23 DIAGNOSIS — Z3202 Encounter for pregnancy test, result negative: Secondary | ICD-10-CM | POA: Diagnosis not present

## 2018-08-23 DIAGNOSIS — Z124 Encounter for screening for malignant neoplasm of cervix: Secondary | ICD-10-CM | POA: Diagnosis not present

## 2018-08-23 DIAGNOSIS — Z1151 Encounter for screening for human papillomavirus (HPV): Secondary | ICD-10-CM | POA: Diagnosis not present

## 2018-08-23 LAB — POCT PREGNANCY, URINE: Preg Test, Ur: NEGATIVE

## 2018-08-23 MED ORDER — LEVONORGESTREL 19.5 MCG/DAY IU IUD
INTRAUTERINE_SYSTEM | Freq: Once | INTRAUTERINE | Status: AC
Start: 2018-08-23 — End: 2018-08-23
  Administered 2018-08-23: 1 via INTRAUTERINE

## 2018-08-23 NOTE — Progress Notes (Signed)
    IUD INSERTION PROCEDURE NOTE  Yolanda Norris is a 48 y.o.  here for Liletta insertion. No GYN concerns.   She was counseled regarding the risks/benefits of IUD including insertion risk of infection, hemorrhage, damage to surrounding tissue and organs, uterine perforation. She was counseled regarding risks of IUD including implantation into uterine wall, malpositioning, misplacement out of the uterus, migration outside of uterus, possible need for hysteroscopic or laparoscopic removal, expulsion. She was advised that risk of pregnancy is low with negative UPT but is not zero and IUD insertion may cause miscarriage. Reviewed that she is also at slightly higher risk for ectopic pregnancy and she should take a pregnancy test if she believes she may be pregnant. She was advised to use backup method of protection for one week. She verbalized understanding of all of the above and consent signed.   Last intercourse was > 2 weeks ago. Last pap smear was on two years ago (no records) and was normal UPT today: negative  IUD Insertion  Patient identified and an adequate time out was performed. Speculum placed in the vagina. The cervix was cleaned with Betadine x 2 and grasped anteriorly with a single tooth tenaculum.  A uterine sound was used to sound the uterus to 6.5 cm;  the IUD was then placed per manufacturer's recommendations. Strings trimmed to 3 cm. Tenaculum was removed, good hemostasis noted. Patient tolerated procedure well.   Patient was given post-procedure instructions.  She was reminded to have backup contraception for one week during this transition period between IUDs.  Patient was also asked to check IUD strings periodically and follow up in 4 weeks for IUD check.  Liletta IUD Exp: 08/2021 Lot: [id number]  K. Arvilla Meres, M.D. Attending Center for [employer] Fish farm manager)

## 2018-08-23 NOTE — Progress Notes (Signed)
GYNECOLOGY OFFICE FOLLOW UP NOTE  History:  48 y.o. No obstetric history on file. here today for follow up for IUD insertion and pap smear.  LMP 08/17/18. Finished her period yesterday, was a normal period for her.  Again reviewed low risk of pregnancy at age 49, patient strongly desires IUD to prevent pregnancy, acknowledges she is low risk of pregnancy.   Past Medical History:  Diagnosis Date  . Anxiety     Past Surgical History:  Procedure Laterality Date  . ANKLE SURGERY    . BELPHAROPTOSIS REPAIR    . SKIN GRAFT      Current Outpatient Medications:  .  albuterol (VENTOLIN HFA) 108 (90 Base) MCG/ACT inhaler, Inhale 1-2 puffs into the lungs every 6 (six) hours as needed for wheezing or shortness of breath., Disp: 1 Inhaler, Rfl: 0 .  erythromycin ophthalmic ointment, Place a 1/2 inch ribbon of ointment into the lower eyelid 4 times a day for 7 days., Disp: 1 g, Rfl: 0 .  fluticasone (FLONASE) 50 MCG/ACT nasal spray, Place 2 sprays into both nostrils daily., Disp: 1 g, Rfl: 0 .  ipratropium (ATROVENT) 0.06 % nasal spray, Place 2 sprays into both nostrils 4 (four) times daily., Disp: 15 mL, Rfl: 0 .  loratadine (CLARITIN) 10 MG tablet, Take 1 tablet (10 mg total) by mouth daily., Disp: 90 tablet, Rfl: 1 .  meclizine (ANTIVERT) 12.5 MG tablet, Take 1 tablet (12.5 mg total) by mouth 3 (three) times daily as needed for dizziness. Do not drive/work after taking, Disp: 30 tablet, Rfl: 0 .  meloxicam (MOBIC) 7.5 MG tablet, Take 1 tablet (7.5 mg total) by mouth daily., Disp: 15 tablet, Rfl: 0 .  montelukast (SINGULAIR) 10 MG tablet, Take 1 tablet (10 mg total) by mouth at bedtime., Disp: 90 tablet, Rfl: 0  The following portions of the patient's history were reviewed and updated as appropriate: allergies, current medications, past family history, past medical history, past social history, past surgical history and problem list.   Review of Systems:  Pertinent items noted in HPI and  remainder of comprehensive ROS otherwise negative.   Objective:  Physical Exam BP 106/72   Pulse 80   Wt 144 lb (65.3 kg)   LMP 08/19/2018   BMI 25.51 kg/m  CONSTITUTIONAL: Well-developed, well-nourished female in no acute distress.  HENT:  Normocephalic, atraumatic. External right and left ear normal. Oropharynx is clear and moist EYES: Conjunctivae and EOM are normal. Pupils are equal, round, and reactive to light. No scleral icterus.  NECK: Normal range of motion, supple, no masses SKIN: Skin is warm and dry. No rash noted. Not diaphoretic. No erythema. No pallor. NEUROLOGIC: Alert and oriented to person, place, and time. Normal reflexes, muscle tone coordination. No cranial nerve deficit noted. PSYCHIATRIC: Normal mood and affect. Normal behavior. Normal judgment and thought content. CARDIOVASCULAR: Normal heart rate noted RESPIRATORY: Effort normal, no problems with respiration noted ABDOMEN: Soft, no distention noted.   PELVIC: Normal appearing external genitalia; normal appearing vaginal mucosa and cervix.  No abnormal discharge noted.  Pap smear obtained.  Normal uterine size, no other palpable masses, no uterine or adnexal tenderness. MUSCULOSKELETAL: Normal range of motion. No edema noted.  Exam done with chaperone present.  Labs and Imaging US Pelvic Complete With Transvaginal  Result Date: 07/31/2018 CLINICAL DATA:  Counseling for IUD placement, irregular cycles for recent months, smoker EXAM: TRANSABDOMINAL AND TRANSVAGINAL ULTRASOUND OF PELVIS TECHNIQUE: Both transabdominal and transvaginal ultrasound examinations of the pelvis were performed.  Transabdominal technique was performed for global imaging of the pelvis including uterus, ovaries, adnexal regions, and pelvic cul-de-sac. It was necessary to proceed with endovaginal exam following the transabdominal exam to visualize the endometrium. COMPARISON:  None FINDINGS: Uterus Measurements: 8.1 x 3.7 x 4.6 cm = volume: 71 mL.  Anteverted. Mildly heterogeneous echogenicity. Questionable intramural leiomyoma at the posterior mid uterus 16 x 14 x 16 mm. Endometrium Thickness: 6 mm. Question hypoechoic endometrial versus sub endometrial nodule at the upper uterine segment 8 x 14 x 10 mm. No endometrial fluid. Right ovary Measurements: 2.7 x 1.6 x 2.0 cm = volume: 4.5 mL. Normal morphology without mass Left ovary Measurements: 3.3 x 2.3 x 3.1 cm = volume: 12.1 mL. Dominant follicle without mass Other findings No free pelvic fluid.  No adnexal masses. IMPRESSION: Probable small posterior intramural uterine leiomyoma 16 mm greatest diameter. Questionable endometrial versus sub endometrial nodule at the upper uterine segment 8 x 14 x 10 mm; consider sonohysterogram for further evaluation, prior to hysteroscopy or endometrial biopsy. Electronically Signed   By: Ulyses SouthwardMark  Boles M.D.   On: 07/31/2018 09:46    Assessment & Plan:  1. Encounter for insertion of mirena IUD See attached procedure note  2. Pap smear for cervical cancer screening Pap done today - will f/u with results   Routine preventative health maintenance measures emphasized. Please refer to After Visit Summary for other counseling recommendations.   Return in about 4 weeks (around 09/20/2018) for string check.   Baldemar LenisK. Meryl , M.D. Attending [facility](Faculty Practice)

## 2018-08-24 ENCOUNTER — Encounter: Payer: Self-pay | Admitting: *Deleted

## 2018-08-25 LAB — CYTOLOGY - PAP
Diagnosis: UNDETERMINED — AB
HPV: NOT DETECTED

## 2018-09-06 ENCOUNTER — Inpatient Hospital Stay: Admission: RE | Admit: 2018-09-06 | Payer: 59 | Source: Ambulatory Visit

## 2018-10-02 ENCOUNTER — Telehealth: Payer: Self-pay | Admitting: Obstetrics and Gynecology

## 2018-10-02 NOTE — Telephone Encounter (Signed)
Attempted to call patient about her appointment on 8/25 @ 8:55. No answer, left voicemail instructing patient to wear a face mask for the entire appointment and no visitors are allowed during the visit. Patient instructed not to attend the appointment if she was any symptoms. Symptom list and office number left.

## 2018-10-03 ENCOUNTER — Encounter: Payer: Self-pay | Admitting: Student

## 2018-10-03 ENCOUNTER — Ambulatory Visit: Payer: 59 | Admitting: Obstetrics and Gynecology

## 2018-11-21 ENCOUNTER — Ambulatory Visit (HOSPITAL_COMMUNITY)
Admission: EM | Admit: 2018-11-21 | Discharge: 2018-11-21 | Disposition: A | Discharge status: Home / Self Care | Payer: 59 | Attending: Family Medicine | Admitting: Family Medicine

## 2018-11-21 ENCOUNTER — Encounter (HOSPITAL_COMMUNITY): Payer: Self-pay

## 2018-11-21 ENCOUNTER — Other Ambulatory Visit: Payer: Self-pay

## 2018-11-21 DIAGNOSIS — N898 Other specified noninflammatory disorders of vagina: Secondary | ICD-10-CM | POA: Insufficient documentation

## 2018-11-21 DIAGNOSIS — Z760 Encounter for issue of repeat prescription: Secondary | ICD-10-CM | POA: Insufficient documentation

## 2018-11-21 DIAGNOSIS — L0291 Cutaneous abscess, unspecified: Secondary | ICD-10-CM | POA: Diagnosis present

## 2018-11-21 DIAGNOSIS — L0231 Cutaneous abscess of buttock: Secondary | ICD-10-CM | POA: Diagnosis not present

## 2018-11-21 DIAGNOSIS — Z79899 Other long term (current) drug therapy: Secondary | ICD-10-CM | POA: Diagnosis not present

## 2018-11-21 DIAGNOSIS — F17210 Nicotine dependence, cigarettes, uncomplicated: Secondary | ICD-10-CM | POA: Diagnosis not present

## 2018-11-21 MED ORDER — METRONIDAZOLE 500 MG PO TABS: 500.0000 mg | ORAL_TABLET | Freq: Two times a day (BID) | ORAL | 0 refills | Status: DC

## 2018-11-21 MED ORDER — CEPHALEXIN 500 MG PO CAPS: 500.0000 mg | ORAL_CAPSULE | Freq: Four times a day (QID) | ORAL | 0 refills | Status: DC

## 2018-11-21 MED ORDER — ALBUTEROL SULFATE HFA 108 (90 BASE) MCG/ACT IN AERS: 1.0000 | INHALATION_SPRAY | Freq: Four times a day (QID) | RESPIRATORY_TRACT | 1 refills | Status: DC | PRN

## 2018-11-21 NOTE — Discharge Instructions (Signed)
Make sure you doing warm compresses multiple times a day on the abscess to promote drainage.  Try to massage the area You can take ibuprofen or Tylenol for pain Keflex for antibiotic coverage  Also treating you for bacterial infection.  Take the Flagyl as prescribed.  Do not drink any alcohol with this medication. We will send your swab for testing and call you with any positive results. Refilled your  albuterol inhaler. Work note given

## 2018-11-21 NOTE — ED Triage Notes (Signed)
Pt states she has a boil on the back of her upper right thigh x 2 days. Pt states she thinks that she maybe have a vaginal infection. Pt states her vagina has a odor. X 2 days. Pt states she needs a refill on her albuterol.

## 2018-11-21 NOTE — ED Provider Notes (Signed)
[facility]    CSN: [id number] Arrival date & time: 11/21/18  [id number]      History   Chief Complaint Chief Complaint  Patient presents with  . Abscess    HPI Yolanda Norris is a 48 y.o. female.   Patient is a 48 year old female with past medical history of anxiety.  She comes in today with concerns for multiple different things.  First thing is an abscess to buttocks area.  This has been present and worsening over the past couple days.  She has been doing warm soaks.  Denies any drainage from the area.  No fever.  She does shave.  She also is having vaginal discharge with foul odor x2 days.  History of bacterial vaginosis.  Similar to previous episodes in the past.  Currently sexually active with one partner, unprotected.  Has IUD and is premenopausal.  Would like to be checked for STDs.  No abdominal pain, back pain, dysuria, hematuria or urinary frequency.  She would also like a refill on her albuterol inhaler  ROS per HPI    Abscess   Past Medical History:  Diagnosis Date  . Anxiety     There are no active problems to display for this patient.   Past Surgical History:  Procedure Laterality Date  . ANKLE SURGERY    . BELPHAROPTOSIS REPAIR    . SKIN GRAFT      OB History   No obstetric history on file.      Home Medications    Prior to Admission medications   Medication Sig Start Date End Date Taking? Authorizing Provider  albuterol (VENTOLIN HFA) 108 (90 Base) MCG/ACT inhaler Inhale 1-2 puffs into the lungs every 6 (six) hours as needed for wheezing or shortness of breath. 11/21/18   Sharisse Rantz, Gloris Manchesterraci A, NP  cephALEXin (KEFLEX) 500 MG capsule Take 1 capsule (500 mg total) by mouth 4 (four) times daily. 11/21/18   Dahlia ByesBast, Doxie Augenstein A, NP  loratadine (CLARITIN) 10 MG tablet Take 1 tablet (10 mg total) by mouth daily. Patient not taking: Reported on 11/21/2018 05/22/17   Wallis BambergMani, Mario, PA-C  meclizine (ANTIVERT) 12.5 MG tablet Take 1 tablet (12.5 mg total)  by mouth 3 (three) times daily as needed for dizziness. Do not drive/work after taking 1/19/145/26/20   Wieters, Hallie C, PA-C  metroNIDAZOLE (FLAGYL) 500 MG tablet Take 1 tablet (500 mg total) by mouth 2 (two) times daily. 11/21/18   Zhara Gieske, Gloris Manchesterraci A, NP  montelukast (SINGULAIR) 10 MG tablet Take 1 tablet (10 mg total) by mouth at bedtime. 05/22/17   Wallis BambergMani, Mario, PA-C  cetirizine (ZYRTEC ALLERGY) 10 MG tablet Take 1 tablet (10 mg total) by mouth daily. 05/22/17 08/19/18  Wallis BambergMani, Mario, PA-C  fluticasone (FLONASE) 50 MCG/ACT nasal spray Place 2 sprays into both nostrils daily. Patient not taking: Reported on 11/21/2018 08/22/17 11/21/18  Belinda FisherYu, Amy V, PA-C  ipratropium (ATROVENT) 0.06 % nasal spray Place 2 sprays into both nostrils 4 (four) times daily. Patient not taking: Reported on 11/21/2018 08/22/17 11/21/18  Belinda FisherYu, Amy V, PA-C    Family History Family History  Problem Relation Age of Onset  . Diabetes Father     Social History Social History   Tobacco Use  . Smoking status: Current Some Day Smoker    Packs/day: 0.50    Types: Cigarettes  . Smokeless tobacco: Never Used  Substance Use Topics  . Alcohol use: No  . Drug use: No     Allergies   Patient  has no known allergies.   Review of Systems Review of Systems   Physical Exam Triage Vital Signs ED Triage Vitals  Enc Vitals Group     BP 11/21/18 0827 106/72     Pulse Rate 11/21/18 0827 79     Resp 11/21/18 0827 16     Temp 11/21/18 0827 98.6 F (37 C)     Temp Source 11/21/18 0827 Oral     SpO2 11/21/18 0827 99 %     Weight 11/21/18 0824 145 lb (65.8 kg)     Height --      Head Circumference --      Peak Flow --      Pain Score 11/21/18 0824 8     Pain Loc --      Pain Edu? --      Excl. in GC? --    No data found.  Updated Vital Signs BP 106/72 (BP Location: Right Arm)   Pulse 79   Temp 98.6 F (37 C) (Oral)   Resp 16   Wt 145 lb (65.8 kg)   SpO2 99%   BMI 25.69 kg/m   Visual Acuity Right Eye Distance:   Left  Eye Distance:   Bilateral Distance:    Right Eye Near:   Left Eye Near:    Bilateral Near:     Physical Exam Vitals signs and nursing note reviewed.  Constitutional:      General: She is not in acute distress.    Appearance: Normal appearance. She is not ill-appearing, toxic-appearing or diaphoretic.  HENT:     Head: Normocephalic.     Nose: Nose normal.     Mouth/Throat:     Pharynx: Oropharynx is clear.  Eyes:     Conjunctiva/sclera: Conjunctivae normal.  Neck:     Musculoskeletal: Normal range of motion.  Pulmonary:     Effort: Pulmonary effort is normal.  Abdominal:     Palpations: Abdomen is soft.     Tenderness: There is no abdominal tenderness.  Genitourinary:      Comments: Approx 1- 2 cm abscess to the right gluteus. Centered pustule with surrounding erythema and induration.  Very tender to touch.  Musculoskeletal: Normal range of motion.  Skin:    General: Skin is warm and dry.     Findings: No rash.  Neurological:     Mental Status: She is alert.  Psychiatric:        Mood and Affect: Mood normal.      UC Treatments / Results  Labs (all labs ordered are listed, but only abnormal results are displayed) Labs Reviewed  CERVICOVAGINAL ANCILLARY ONLY    EKG   Radiology No results found.  Procedures Procedures (including critical care time)  Medications Ordered in UC Medications - No data to display  Initial Impression / Assessment and Plan / UC Course  I have reviewed the triage vital signs and the nursing notes.  Pertinent labs & imaging results that were available during my care of the patient were reviewed by me and considered in my medical decision making (see chart for details).     Abscess- no need for I&D today. Warm compresses. Keflex for infection.  Tylenol or ibuprofen for pain  Vaginal discharge-treating for BV based on symptoms and history.  Will send swab for testing.  Refilled albuterol inhaler  Work note given Follow up  as needed for continued or worsening symptoms  Final Clinical Impressions(s) / UC Diagnoses   Final diagnoses:  Abscess  Vaginal odor  Medication refill     Discharge Instructions     Make sure you doing warm compresses multiple times a day on the abscess to promote drainage.  Try to massage the area You can take ibuprofen or Tylenol for pain Keflex for antibiotic coverage  Also treating you for bacterial infection.  Take the Flagyl as prescribed.  Do not drink any alcohol with this medication. We will send your swab for testing and call you with any positive results. Refilled your  albuterol inhaler. Work note given     ED Prescriptions    Medication Sig Dispense Auth. Provider   albuterol (VENTOLIN HFA) 108 (90 Base) MCG/ACT inhaler Inhale 1-2 puffs into the lungs every 6 (six) hours as needed for wheezing or shortness of breath. 18 g Temperence Zenor A, NP   cephALEXin (KEFLEX) 500 MG capsule Take 1 capsule (500 mg total) by mouth 4 (four) times daily. 28 capsule Jeet Shough A, NP   metroNIDAZOLE (FLAGYL) 500 MG tablet Take 1 tablet (500 mg total) by mouth 2 (two) times daily. 14 tablet Macky Galik A, NP     PDMP not reviewed this encounter.   Orvan July, NP 11/21/18 [phone]

## 2018-11-24 LAB — CERVICOVAGINAL ANCILLARY ONLY
Bacterial vaginitis: POSITIVE — AB
Candida vaginitis: NEGATIVE
Chlamydia: NEGATIVE
Neisseria Gonorrhea: NEGATIVE
Trichomonas: POSITIVE — AB

## 2018-11-27 ENCOUNTER — Telehealth (HOSPITAL_COMMUNITY): Payer: Self-pay | Admitting: Emergency Medicine

## 2018-11-27 NOTE — Telephone Encounter (Signed)
Bacterial Vaginosis test is positive.  Prescription for metronidazole was given at the urgent care visit.  Trichomonas is positive. Rx metronidazole was given at the urgent care visit. Pt needs education to please refrain from sexual intercourse for 7 days to give the medicine time to work. Sexual partners need to be notified and tested/treated. Condoms may reduce risk of reinfection. Recheck for further evaluation if symptoms are not improving.   Patient contacted and made aware of    results, all questions answered   

## 2018-12-03 ENCOUNTER — Other Ambulatory Visit: Payer: Self-pay

## 2018-12-03 ENCOUNTER — Ambulatory Visit (HOSPITAL_COMMUNITY)
Admission: EM | Admit: 2018-12-03 | Discharge: 2018-12-03 | Disposition: A | Discharge status: Home / Self Care | Payer: 59

## 2018-12-03 ENCOUNTER — Encounter (HOSPITAL_COMMUNITY): Payer: Self-pay

## 2018-12-03 DIAGNOSIS — N760 Acute vaginitis: Secondary | ICD-10-CM | POA: Diagnosis not present

## 2018-12-03 DIAGNOSIS — A599 Trichomoniasis, unspecified: Secondary | ICD-10-CM

## 2018-12-03 DIAGNOSIS — L089 Local infection of the skin and subcutaneous tissue, unspecified: Secondary | ICD-10-CM

## 2018-12-03 DIAGNOSIS — B9689 Other specified bacterial agents as the cause of diseases classified elsewhere: Secondary | ICD-10-CM | POA: Diagnosis not present

## 2018-12-03 NOTE — ED Triage Notes (Addendum)
Patient presents to Urgent Care with complaints of needing treatment for trichomonas since being told she tested positive for it. Patient reports her partner was just tested and he was negative for trich but positive for herpes pt is not having an active outbreak.

## 2018-12-03 NOTE — ED Provider Notes (Signed)
MRN: [id number] DOB: Nov 20, 1970  Subjective:   Yolanda Norris is a 48 y.o. female presenting for consult on trichomoniasis.  Patient is currently taking Flagyl and Keflex as prescribed from her previous office visit and following test results.  Patient is currently with a partner that to her knowledge was being monogamous.  She is concerned that she did not get trichomoniasis from STI but a toilet seat she found this information on the Internet.  She reports that her partner also tested positive for herpes and is concerned about this too.  She has not had any kind of painful genital rash.  Her symptoms have improved from her initial visit.  No current facility-administered medications for this encounter.   Current Outpatient Medications:  .  albuterol (VENTOLIN HFA) 108 (90 Base) MCG/ACT inhaler, Inhale 1-2 puffs into the lungs every 6 (six) hours as needed for wheezing or shortness of breath., Disp: 18 g, Rfl: 1 .  cephALEXin (KEFLEX) 500 MG capsule, Take 1 capsule (500 mg total) by mouth 4 (four) times daily., Disp: 28 capsule, Rfl: 0 .  loratadine (CLARITIN) 10 MG tablet, Take 1 tablet (10 mg total) by mouth daily. (Patient not taking: Reported on 11/21/2018), Disp: 90 tablet, Rfl: 1 .  meclizine (ANTIVERT) 12.5 MG tablet, Take 1 tablet (12.5 mg total) by mouth 3 (three) times daily as needed for dizziness. Do not drive/work after taking, Disp: 30 tablet, Rfl: 0 .  metroNIDAZOLE (FLAGYL) 500 MG tablet, Take 1 tablet (500 mg total) by mouth 2 (two) times daily., Disp: 14 tablet, Rfl: 0 .  montelukast (SINGULAIR) 10 MG tablet, Take 1 tablet (10 mg total) by mouth at bedtime., Disp: 90 tablet, Rfl: 0   No Known Allergies  Past Medical History:  Diagnosis Date  . Anxiety      Past Surgical History:  Procedure Laterality Date  . ANKLE SURGERY    . BELPHAROPTOSIS REPAIR    . SKIN GRAFT      ROS  Objective:   Vitals: BP (!) 141/79 (BP Location: Left Arm)   Pulse 76   Temp 98.6  F (37 C) (Oral)   Resp 16   SpO2 100%   Physical Exam Constitutional:      General: She is not in acute distress.    Appearance: Normal appearance. She is well-developed. She is not ill-appearing.  HENT:     Head: Normocephalic and atraumatic.     Nose: Nose normal.     Mouth/Throat:     Mouth: Mucous membranes are moist.     Pharynx: Oropharynx is clear.  Eyes:     General: No scleral icterus.    Extraocular Movements: Extraocular movements intact.     Pupils: Pupils are equal, round, and reactive to light.  Cardiovascular:     Rate and Rhythm: Normal rate.  Pulmonary:     Effort: Pulmonary effort is normal.  Skin:    General: Skin is warm and dry.  Neurological:     General: No focal deficit present.     Mental Status: She is alert and oriented to person, place, and time.  Psychiatric:        Mood and Affect: Mood normal.        Behavior: Behavior normal.      Assessment and Plan :   1. BV (bacterial vaginosis)   2. Trichomonas infection   3. Superficial bacterial skin infection    Discussed nature of STIs with patient and recommended she finish Flagyl for her  BV and trichomonas.  Counseled against antibody testing for herpes given that patient has not had any herpes-like rash in the genital area.  Patient is to finish Keflex for her superficial skin infection associated with shaving.  Recommended using clippers with a guard instead. Counseled patient on potential for adverse effects with medications prescribed/recommended today, ER and return-to-clinic precautions discussed, patient verbalized understanding.    Wallis Bamberg, PA-C 12/03/18 1100

## 2018-12-09 ENCOUNTER — Encounter (HOSPITAL_COMMUNITY): Payer: Self-pay

## 2018-12-09 ENCOUNTER — Ambulatory Visit (INDEPENDENT_AMBULATORY_CARE_PROVIDER_SITE_OTHER): Payer: 59

## 2018-12-09 ENCOUNTER — Ambulatory Visit (HOSPITAL_COMMUNITY)
Admission: EM | Admit: 2018-12-09 | Discharge: 2018-12-09 | Disposition: A | Discharge status: Home / Self Care | Payer: 59 | Attending: Internal Medicine | Admitting: Internal Medicine

## 2018-12-09 DIAGNOSIS — J029 Acute pharyngitis, unspecified: Secondary | ICD-10-CM

## 2018-12-09 DIAGNOSIS — Z79899 Other long term (current) drug therapy: Secondary | ICD-10-CM | POA: Diagnosis not present

## 2018-12-09 DIAGNOSIS — Z833 Family history of diabetes mellitus: Secondary | ICD-10-CM | POA: Insufficient documentation

## 2018-12-09 DIAGNOSIS — R0789 Other chest pain: Secondary | ICD-10-CM

## 2018-12-09 DIAGNOSIS — R05 Cough: Secondary | ICD-10-CM | POA: Diagnosis not present

## 2018-12-09 DIAGNOSIS — R062 Wheezing: Secondary | ICD-10-CM

## 2018-12-09 DIAGNOSIS — Z20828 Contact with and (suspected) exposure to other viral communicable diseases: Secondary | ICD-10-CM | POA: Insufficient documentation

## 2018-12-09 DIAGNOSIS — F17210 Nicotine dependence, cigarettes, uncomplicated: Secondary | ICD-10-CM | POA: Insufficient documentation

## 2018-12-09 DIAGNOSIS — Z7952 Long term (current) use of systemic steroids: Secondary | ICD-10-CM | POA: Diagnosis not present

## 2018-12-09 DIAGNOSIS — R519 Headache, unspecified: Secondary | ICD-10-CM

## 2018-12-09 DIAGNOSIS — J22 Unspecified acute lower respiratory infection: Secondary | ICD-10-CM | POA: Insufficient documentation

## 2018-12-09 LAB — POCT RAPID STREP A: Streptococcus, Group A Screen (Direct): NEGATIVE

## 2018-12-09 MED ORDER — ALBUTEROL SULFATE HFA 108 (90 BASE) MCG/ACT IN AERS: 1.0000 | INHALATION_SPRAY | Freq: Four times a day (QID) | RESPIRATORY_TRACT | 0 refills | Status: DC | PRN

## 2018-12-09 MED ORDER — PREDNISONE 20 MG PO TABS: 40.0000 mg | ORAL_TABLET | Freq: Every day | ORAL | 0 refills | Status: AC

## 2018-12-09 MED ORDER — DM-GUAIFENESIN ER 30-600 MG PO TB12: 1.0000 | ORAL_TABLET | Freq: Two times a day (BID) | ORAL | 0 refills | Status: DC

## 2018-12-09 MED ORDER — BENZONATATE 200 MG PO CAPS: 200.0000 mg | ORAL_CAPSULE | Freq: Three times a day (TID) | ORAL | 0 refills | Status: AC | PRN

## 2018-12-09 MED ORDER — METHYLPREDNISOLONE SODIUM SUCC 125 MG IJ SOLR
INTRAMUSCULAR | Status: AC
  Filled 2018-12-09: qty 2

## 2018-12-09 MED ORDER — AZITHROMYCIN 250 MG PO TABS: 250.0000 mg | ORAL_TABLET | Freq: Every day | ORAL | 0 refills | Status: DC

## 2018-12-09 MED ORDER — METHYLPREDNISOLONE SODIUM SUCC 125 MG IJ SOLR
125.0000 mg | Freq: Once | INTRAMUSCULAR | Status: AC
  Administered 2018-12-09: 12:00:00 125 mg via INTRAMUSCULAR

## 2018-12-09 NOTE — ED Triage Notes (Signed)
Pt states having cough and nasal congestion  x 3 days. Pt states she has chest pressure when she lays down x 7 days, pt states the pressure in her chest feels like when she had pneumonia. Pt states having hedache, wheezing, chills x 2 days.

## 2018-12-09 NOTE — ED Provider Notes (Signed)
[facility]    CSN: [id number] Arrival date & time: 12/09/18  1005      History   Chief Complaint Chief Complaint  Patient presents with   Headache   Cough   Sore Throat   Nasal Congestion   Chills   Chest Pain   Wheezing    HPI Yolanda Norris is a 48 y.o. female history of tobacco use, presenting today for evaluation of cough congestion and shortness of breath.  Patient states that since the beginning of the week over the past 6 to 7 days she has had a cough, congestion, throat irritation.  Recently she has developed increased wheezing, shortness of breath as well as chest pressure.  She is concerned that she notices the chest pressure worse when she lies flat.  She felt the same way when she previously had pneumonia.  She denies any fevers.  Has had a couple possible Covid exposures from work, but no known direct or close Covid exposures.  She has been using her albuterol inhaler without full relief.  Smokes approximately half pack to 1 pack/day.  HPI  Past Medical History:  Diagnosis Date   Anxiety     There are no active problems to display for this patient.   Past Surgical History:  Procedure Laterality Date   ANKLE SURGERY     BELPHAROPTOSIS REPAIR     SKIN GRAFT      OB History   No obstetric history on file.      Home Medications    Prior to Admission medications   Medication Sig Start Date End Date Taking? Authorizing Provider  albuterol (VENTOLIN HFA) 108 (90 Base) MCG/ACT inhaler Inhale 1-2 puffs into the lungs every 6 (six) hours as needed for wheezing or shortness of breath. 12/09/18   [place] Cantero C, PA-C  azithromycin (ZITHROMAX) 250 MG tablet Take 1 tablet (250 mg total) by mouth daily. Take first 2 tablets together, then 1 every day until finished. 12/09/18   Adela Esteban C, PA-C  benzonatate (TESSALON) 200 MG capsule Take 1 capsule (200 mg total) by mouth 3 (three) times daily as needed for up to 7 days for  cough. 12/09/18 12/16/18  Chantay Whitelock C, PA-C  dextromethorphan-guaiFENesin (MUCINEX DM) 30-600 MG 12hr tablet Take 1 tablet by mouth 2 (two) times daily. 12/09/18   Audry Kauzlarich C, PA-C  montelukast (SINGULAIR) 10 MG tablet Take 1 tablet (10 mg total) by mouth at bedtime. 05/22/17   Wallis BambergMani, Mario, PA-C  predniSONE (DELTASONE) 20 MG tablet Take 2 tablets (40 mg total) by mouth daily for 5 days. 12/09/18 12/14/18  Shavon Zenz C, PA-C  cetirizine (ZYRTEC ALLERGY) 10 MG tablet Take 1 tablet (10 mg total) by mouth daily. 05/22/17 08/19/18  Wallis BambergMani, Mario, PA-C  fluticasone (FLONASE) 50 MCG/ACT nasal spray Place 2 sprays into both nostrils daily. Patient not taking: Reported on 11/21/2018 08/22/17 11/21/18  Belinda FisherYu, Amy V, PA-C  ipratropium (ATROVENT) 0.06 % nasal spray Place 2 sprays into both nostrils 4 (four) times daily. Patient not taking: Reported on 11/21/2018 08/22/17 11/21/18  Belinda FisherYu, Amy V, PA-C  loratadine (CLARITIN) 10 MG tablet Take 1 tablet (10 mg total) by mouth daily. Patient not taking: Reported on 11/21/2018 05/22/17 12/09/18  Wallis BambergMani, Mario, PA-C    Family History Family History  Problem Relation Age of Onset   Diabetes Father     Social History Social History   Tobacco Use   Smoking status: Current Some Day Smoker    Packs/day:  0.50    Types: Cigarettes   Smokeless tobacco: Never Used  Substance Use Topics   Alcohol use: No   Drug use: No     Allergies   Patient has no known allergies.   Review of Systems Review of Systems  Constitutional: Negative for activity change, appetite change, chills, fatigue and fever.  HENT: Positive for congestion and sore throat. Negative for ear pain, rhinorrhea, sinus pressure and trouble swallowing.   Eyes: Negative for discharge and redness.  Respiratory: Positive for cough, chest tightness, shortness of breath and wheezing.   Cardiovascular: Negative for chest pain.  Gastrointestinal: Negative for abdominal pain, diarrhea, nausea  and vomiting.  Musculoskeletal: Negative for myalgias.  Skin: Negative for rash.  Neurological: Negative for dizziness, light-headedness and headaches.     Physical Exam Triage Vital Signs ED Triage Vitals  Enc Vitals Group     BP 12/09/18 1025 121/81     Pulse Rate 12/09/18 1025 78     Resp 12/09/18 1025 17     Temp 12/09/18 1025 98.7 F (37.1 C)     Temp Source 12/09/18 1025 Temporal     SpO2 12/09/18 1025 100 %     Weight --      Height --      Head Circumference --      Peak Flow --      Pain Score 12/09/18 1023 6     Pain Loc --      Pain Edu? --      Excl. in GC? --    No data found.  Updated Vital Signs BP 121/81 (BP Location: Left Arm)    Pulse 78    Temp 98.7 F (37.1 C) (Temporal)    Resp 17    SpO2 100%   Visual Acuity Right Eye Distance:   Left Eye Distance:   Bilateral Distance:    Right Eye Near:   Left Eye Near:    Bilateral Near:     Physical Exam Vitals signs and nursing note reviewed.  Constitutional:      General: She is not in acute distress.    Appearance: She is well-developed.  HENT:     Head: Normocephalic and atraumatic.     Ears:     Comments: Bilateral ears without tenderness to palpation of external auricle, tragus and mastoid, EAC's without erythema or swelling, TM's with good bony landmarks and cone of light. Non erythematous.     Mouth/Throat:     Comments: Oral mucosa pink and moist, no tonsillar enlargement or exudate. Posterior pharynx patent and nonerythematous, no uvula deviation or swelling. Normal phonation. Eyes:     Conjunctiva/sclera: Conjunctivae normal.  Neck:     Musculoskeletal: Neck supple.  Cardiovascular:     Rate and Rhythm: Normal rate and regular rhythm.     Heart sounds: No murmur.  Pulmonary:     Effort: Pulmonary effort is normal. No respiratory distress.     Breath sounds: Wheezing and rhonchi present.     Comments: Significant inspiratory and expiratory wheezing throughout all lung fields, lung  sounds sound coarse throughout. Abdominal:     Palpations: Abdomen is soft.     Tenderness: There is no abdominal tenderness.  Musculoskeletal:     Comments: Anterior right chest tender to palpation  Skin:    General: Skin is warm and dry.  Neurological:     Mental Status: She is alert.      UC Treatments / Results  Labs (all labs  ordered are listed, but only abnormal results are displayed) Labs Reviewed  NOVEL CORONAVIRUS, NAA (HOSP ORDER, SEND-OUT TO REF LAB; TAT 18-24 HRS)  CULTURE, GROUP A STREP [facility])  POCT RAPID STREP A    EKG   Radiology Dg Chest 2 View  Result Date: 12/09/2018 CLINICAL DATA:  Cough, wheezing, chest pressure EXAM: CHEST - 2 VIEW COMPARISON:  05/22/2017 FINDINGS: The heart size and mediastinal contours are within normal limits. Both lungs are clear. The visualized skeletal structures are unremarkable. IMPRESSION: No acute abnormality of the lungs. Electronically Signed   By: Lauralyn Primes M.D.   On: 12/09/2018 11:04    Procedures Procedures (including critical care time)  Medications Ordered in UC Medications  methylPREDNISolone sodium succinate (SOLU-MEDROL) 125 mg/2 mL injection 125 mg (125 mg Intramuscular Given 12/09/18 1132)  methylPREDNISolone sodium succinate (SOLU-MEDROL) 125 mg/2 mL injection (has no administration in time range)    Initial Impression / Assessment and Plan / UC Course  I have reviewed the triage vital signs and the nursing notes.  Pertinent labs & imaging results that were available during my care of the patient were reviewed by me and considered in my medical decision making (see chart for details).   Covid pending. Chest x-ray negative for pneumonia.  Exam consistent with bronchitis.  Given amount of wheezing will provide Solu-Medrol IM today and will continue on prednisone daily for the next 4 to 5 days.  Symptoms approximately 1 week we will go ahead and initiate on azithromycin to cover atypicals as well.  Tessalon  for cough, Mucinex DM for congestion.  Continue to monitor,Discussed strict return precautions. Patient verbalized understanding and is agreeable with plan.   Final Clinical Impressions(s) / UC Diagnoses   Final diagnoses:  Lower respiratory infection (e.g., bronchitis, pneumonia, pneumonitis, pulmonitis)     Discharge Instructions     We gave you a shot of Solu-Medrol today to help with your shortness of breath and wheezing, please continue with prednisone 40 mg / 2 tablets daily for the next 5 days Continue using albuterol inhaler as needed for shortness of breath and wheezing Begin azithromycin-2 tablets today, 1 tablet for the following 4 days Tessalon as needed for cough every 8 hours Mucinex DM twice daily to help with cough and congestion  Please rest and drink plenty of fluids  Monitor my chart for Covid results  Please follow-up if symptoms not resolving or worsening, developing increase shortness of breath or difficulty breathing    ED Prescriptions    Medication Sig Dispense Auth. Provider   azithromycin (ZITHROMAX) 250 MG tablet Take 1 tablet (250 mg total) by mouth daily. Take first 2 tablets together, then 1 every day until finished. 6 tablet Ishmail Mcmanamon C, PA-C   albuterol (VENTOLIN HFA) 108 (90 Base) MCG/ACT inhaler Inhale 1-2 puffs into the lungs every 6 (six) hours as needed for wheezing or shortness of breath. 8 g Gabrielle Wakeland C, PA-C   predniSONE (DELTASONE) 20 MG tablet Take 2 tablets (40 mg total) by mouth daily for 5 days. 10 tablet Shmuel Girgis C, PA-C   benzonatate (TESSALON) 200 MG capsule Take 1 capsule (200 mg total) by mouth 3 (three) times daily as needed for up to 7 days for cough. 28 capsule Jamauri Kruzel C, PA-C   dextromethorphan-guaiFENesin (MUCINEX DM) 30-600 MG 12hr tablet Take 1 tablet by mouth 2 (two) times daily. 20 tablet Crissa Sowder, [place] C, PA-C     PDMP not reviewed this encounter.   Rifky Lapre, [place] C,  PA-C 12/09/18 1141

## 2018-12-09 NOTE — Discharge Instructions (Signed)
We gave you a shot of Solu-Medrol today to help with your shortness of breath and wheezing, please continue with prednisone 40 mg / 2 tablets daily for the next 5 days Continue using albuterol inhaler as needed for shortness of breath and wheezing Begin azithromycin-2 tablets today, 1 tablet for the following 4 days Tessalon as needed for cough every 8 hours Mucinex DM twice daily to help with cough and congestion  Please rest and drink plenty of fluids  Monitor my chart for Covid results  Please follow-up if symptoms not resolving or worsening, developing increase shortness of breath or difficulty breathing

## 2018-12-10 LAB — NOVEL CORONAVIRUS, NAA (HOSP ORDER, SEND-OUT TO REF LAB; TAT 18-24 HRS): SARS-CoV-2, NAA: NOT DETECTED

## 2018-12-12 LAB — CULTURE, GROUP A STREP (THRC)

## 2019-04-16 ENCOUNTER — Encounter (HOSPITAL_COMMUNITY): Payer: Self-pay | Admitting: *Deleted

## 2019-04-16 ENCOUNTER — Ambulatory Visit (HOSPITAL_COMMUNITY)
Admission: EM | Admit: 2019-04-16 | Discharge: 2019-04-16 | Disposition: A | Discharge status: Home / Self Care | Payer: 59 | Attending: Family Medicine | Admitting: Family Medicine

## 2019-04-16 ENCOUNTER — Other Ambulatory Visit: Payer: Self-pay

## 2019-04-16 DIAGNOSIS — Z20822 Contact with and (suspected) exposure to covid-19: Secondary | ICD-10-CM | POA: Insufficient documentation

## 2019-04-16 DIAGNOSIS — R519 Headache, unspecified: Secondary | ICD-10-CM | POA: Diagnosis not present

## 2019-04-16 DIAGNOSIS — J029 Acute pharyngitis, unspecified: Secondary | ICD-10-CM | POA: Diagnosis not present

## 2019-04-16 HISTORY — DX: Unspecified asthma, uncomplicated: J45.909

## 2019-04-16 MED ORDER — ALBUTEROL SULFATE HFA 108 (90 BASE) MCG/ACT IN AERS: 1.0000 | INHALATION_SPRAY | Freq: Four times a day (QID) | RESPIRATORY_TRACT | 2 refills | Status: DC | PRN

## 2019-04-16 NOTE — ED Triage Notes (Addendum)
Patient states that several coworkers tested positive for COVID, patient states that she has had headache and sore throat. Patient states that she has hx asthma and allergies. Would like to be tested for COVID.   Patient would like rx for albuterol inhaler, doesn't have anymore.

## 2019-04-16 NOTE — Discharge Instructions (Addendum)
You have been tested for COVID-19 today. °If your test returns positive, you will receive a phone call from [facility] regarding your results. °Negative test results are not called. °Both positive and negative results area always visible on MyChart. °If you do not have a MyChart account, sign up instructions are provided in your discharge papers. °Please do not hesitate to contact us should you have questions or concerns. ° °

## 2019-04-17 NOTE — ED Provider Notes (Signed)
[facility]   [id number] 04/16/19 Arrival Time: 1913  ASSESSMENT & PLAN:  1. Exposure to COVID-19 virus      COVID-19 testing sent. See letter/work note on file for self-isolation guidelines.    Discharge Instructions     You have been tested for COVID-19 today. If your test returns positive, you will receive a phone call from [facility] regarding your results. Negative test results are not called. Both positive and negative results area always visible on MyChart. If you do not have a MyChart account, sign up instructions are provided in your discharge papers. Please do not hesitate to contact us should you have questions or concerns.        Reviewed expectations re: course of current medical issues. Questions answered. Outlined signs and symptoms indicating need for more acute intervention. Understanding verbalized. After Visit Summary given.   SUBJECTIVE: History from: patient. Yolanda Norris is a 49 y.o. female who requests COVID-19 testing. Known COVID-19 contact: co-worker who recently tested positive. Recent travel: none. Denies: runny nose, congestion and difficulty breathing. Does reports a headache and sore throat for a couple of days. No fever. Normal PO intake without n/v/d.    OBJECTIVE:  Vitals:   04/16/19 1952  BP: (!) 133/95  Pulse: 72  Resp: 16  Temp: 97.9 F (36.6 C)  TempSrc: Oral  SpO2: 100%    General appearance: alert; no distress Eyes: PERRLA; EOMI; conjunctiva normal HENT: [place]; AT; nasal mucosa normal; oral mucosa normal Neck: supple  Lungs: speaks full sentences without difficulty; unlabored Extremities: no edema Skin: warm and dry Neurologic: normal gait Psychological: alert and cooperative; normal mood and affect  Labs: Labs Reviewed  NOVEL CORONAVIRUS, NAA (HOSP ORDER, SEND-OUT TO REF LAB; TAT 18-24 HRS)     No Known Allergies  Past Medical History:  Diagnosis Date  . Anxiety   . Asthma    Social  History   Socioeconomic History  . Marital status: Single    Spouse name: Not on file  . Number of children: Not on file  . Years of education: Not on file  . Highest education level: Not on file  Occupational History  . Not on file  Tobacco Use  . Smoking status: Current Some Day Smoker    Packs/day: 0.50    Types: Cigarettes  . Smokeless tobacco: Never Used  Substance and Sexual Activity  . Alcohol use: No  . Drug use: No  . Sexual activity: Not on file  Other Topics Concern  . Not on file  Social History Narrative  . Not on file   Social Determinants of Health   Financial Resource Strain:   . Difficulty of Paying Living Expenses: Not on file  Food Insecurity:   . Worried About Programme researcher, broadcasting/film/video in the Last Year: Not on file  . Ran Out of Food in the Last Year: Not on file  Transportation Needs:   . Lack of Transportation (Medical): Not on file  . Lack of Transportation (Non-Medical): Not on file  Physical Activity:   . Days of Exercise per Week: Not on file  . Minutes of Exercise per Session: Not on file  Stress:   . Feeling of Stress : Not on file  Social Connections:   . Frequency of Communication with Friends and Family: Not on file  . Frequency of Social Gatherings with Friends and Family: Not on file  . Attends Religious Services: Not on file  . Active Member of Clubs  or Organizations: Not on file  . Attends Archivist Meetings: Not on file  . Marital Status: Not on file  Intimate Partner Violence:   . Fear of Current or Ex-Partner: Not on file  . Emotionally Abused: Not on file  . Physically Abused: Not on file  . Sexually Abused: Not on file   Family History  Problem Relation Age of Onset  . Diabetes Father    Past Surgical History:  Procedure Laterality Date  . ANKLE SURGERY    . BELPHAROPTOSIS REPAIR    . SKIN GRAFT       Vanessa Kick, MD 04/17/19 1025

## 2019-04-18 LAB — NOVEL CORONAVIRUS, NAA (HOSP ORDER, SEND-OUT TO REF LAB; TAT 18-24 HRS): SARS-CoV-2, NAA: NOT DETECTED

## 2019-07-16 ENCOUNTER — Ambulatory Visit (HOSPITAL_COMMUNITY)
Admission: EM | Admit: 2019-07-16 | Discharge: 2019-07-16 | Disposition: A | Discharge status: Home / Self Care | Payer: 59 | Attending: Family Medicine | Admitting: Family Medicine

## 2019-07-16 ENCOUNTER — Encounter (HOSPITAL_COMMUNITY): Payer: Self-pay | Admitting: Emergency Medicine

## 2019-07-16 DIAGNOSIS — Z20822 Contact with and (suspected) exposure to covid-19: Secondary | ICD-10-CM | POA: Diagnosis present

## 2019-07-16 DIAGNOSIS — J45909 Unspecified asthma, uncomplicated: Secondary | ICD-10-CM

## 2019-07-16 MED ORDER — MONTELUKAST SODIUM 10 MG PO TABS: 10.0000 mg | ORAL_TABLET | Freq: Every day | ORAL | 0 refills | Status: DC

## 2019-07-16 NOTE — ED Triage Notes (Signed)
Pt states she was exposed to a person positive for covid on 07-12-19. Pt states she has asthma and she is concerned.

## 2019-07-16 NOTE — ED Provider Notes (Signed)
[facility]   [id number] 07/16/19 Arrival Time: 3295  ASSESSMENT & PLAN:  1. Close exposure to COVID-19 virus   2. Chronic asthma without complication, unspecified asthma severity, unspecified whether persistent     Refilled at request: Meds ordered this encounter  Medications  . montelukast (SINGULAIR) 10 MG tablet    Sig: Take 1 tablet (10 mg total) by mouth at bedtime.    Dispense:  30 tablet    Refill:  0    COVID-19 testing sent. See letter/work note on file for self-isolation guidelines. OTC symptom care as needed.  Follow-up Information    [facility].   Specialty: Urgent Care Why: As needed. Contact information: [place] [address] [phone]          Reviewed expectations re: course of current medical issues. Questions answered. Outlined signs and symptoms indicating need for more acute intervention. Understanding verbalized. After Visit Summary given.   SUBJECTIVE: History from: patient. Yolanda Norris is a 49 y.o. female who requests COVID-19 testing. Known COVID-19 contact: work exposure. Recent travel: none. Reports: mild wheezing but this can be her baseline with asthma. Denies: congestion, difficulty breathing and headache. Normal PO intake without n/v/d. Requests refill of Singulair.   OBJECTIVE:  Vitals:   07/16/19 0901  BP: 122/64  Pulse: 61  Resp: 18  Temp: 98.5 F (36.9 C)  TempSrc: Oral  SpO2: 100%    General appearance: alert; no distress Eyes: PERRLA; EOMI; conjunctiva normal HENT: [place]; AT; nasal mucosa normal; oral mucosa normal Neck: supple  Lungs: speaks full sentences without difficulty; unlabored; no active wheezing Extremities: no edema Skin: warm and dry Neurologic: normal gait Psychological: alert and cooperative; normal mood and affect  Labs:  Labs Reviewed  SARS CORONAVIRUS 2 (TAT 6-24 HRS)     No Known Allergies  Past  Medical History:  Diagnosis Date  . Anxiety   . Asthma    Social History   Socioeconomic History  . Marital status: Single    Spouse name: Not on file  . Number of children: Not on file  . Years of education: Not on file  . Highest education level: Not on file  Occupational History  . Not on file  Tobacco Use  . Smoking status: Current Some Day Smoker    Packs/day: 0.50    Types: Cigarettes  . Smokeless tobacco: Never Used  Substance and Sexual Activity  . Alcohol use: No  . Drug use: No  . Sexual activity: Not on file  Other Topics Concern  . Not on file  Social History Narrative  . Not on file   Social Determinants of Health   Financial Resource Strain:   . Difficulty of Paying Living Expenses:   Food Insecurity:   . Worried About Charity fundraiser in the Last Year:   . Arboriculturist in the Last Year:   Transportation Needs:   . Film/video editor (Medical):   Marland Kitchen Lack of Transportation (Non-Medical):   Physical Activity:   . Days of Exercise per Week:   . Minutes of Exercise per Session:   Stress:   . Feeling of Stress :   Social Connections:   . Frequency of Communication with Friends and Family:   . Frequency of Social Gatherings with Friends and Family:   . Attends Religious Services:   . Active Member of Clubs or Organizations:   . Attends Archivist Meetings:   .  Marital Status:   Intimate Partner Violence:   . Fear of Current or Ex-Partner:   . Emotionally Abused:   Marland Kitchen Physically Abused:   . Sexually Abused:    Family History  Problem Relation Age of Onset  . Diabetes Father    Past Surgical History:  Procedure Laterality Date  . ANKLE SURGERY    . BELPHAROPTOSIS REPAIR    . SKIN GRAFT       Mardella Layman, MD 07/16/19 1026

## 2019-07-16 NOTE — Discharge Instructions (Addendum)
You have been tested for COVID-19 today. °If your test returns positive, you will receive a phone call from [facility] regarding your results. °Negative test results are not called. °Both positive and negative results area always visible on MyChart. °If you do not have a MyChart account, sign up instructions are provided in your discharge papers. °Please do not hesitate to contact us should you have questions or concerns. ° °

## 2019-07-17 LAB — SARS CORONAVIRUS 2 (TAT 6-24 HRS): SARS Coronavirus 2: NEGATIVE

## 2019-10-06 IMAGING — US US PELVIS COMPLETE WITH TRANSVAGINAL
1 series · 15 of 25 positions shown · non-contrast
Comparison: None

CLINICAL DATA: Counseling for IUD placement, irregular cycles for
recent months, smoker



[Series 1: us pelvis complete with transvaginal · 15 of 106 slices shown]
[im 1/106]
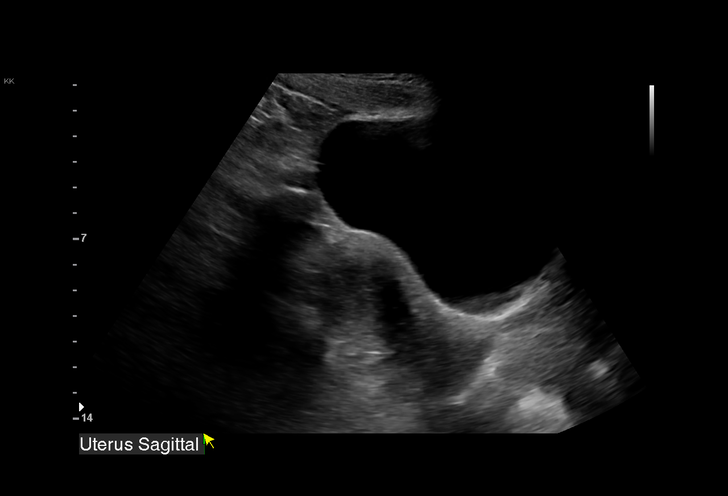
[im 9/106]
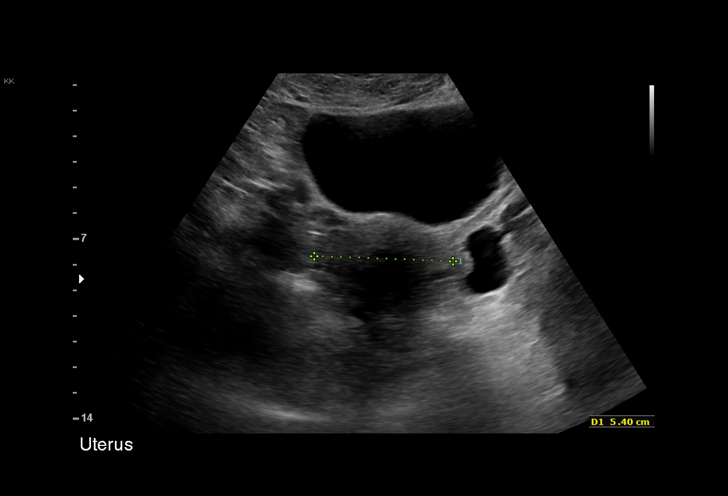
[im 18/106]
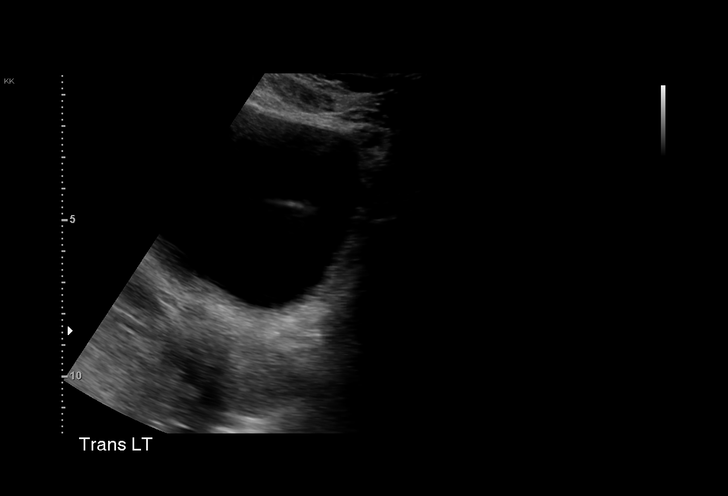
[im 22/106]
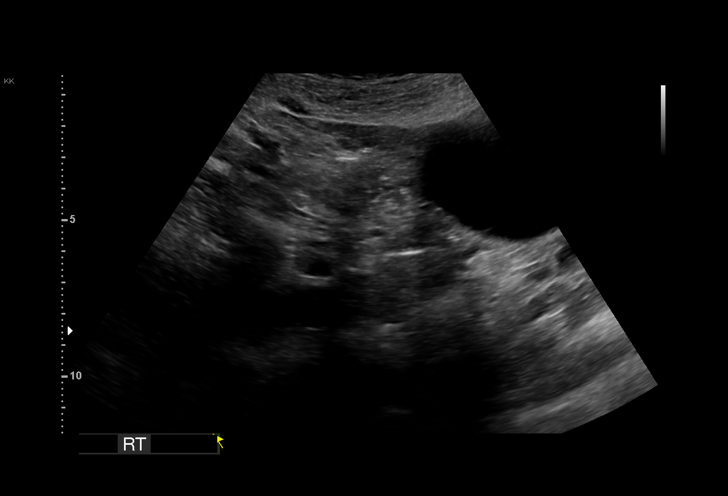
[im 31/106]
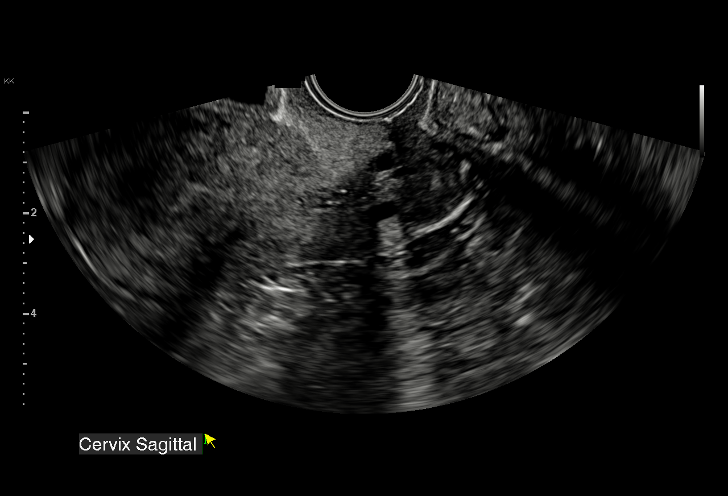
[im 40/106]
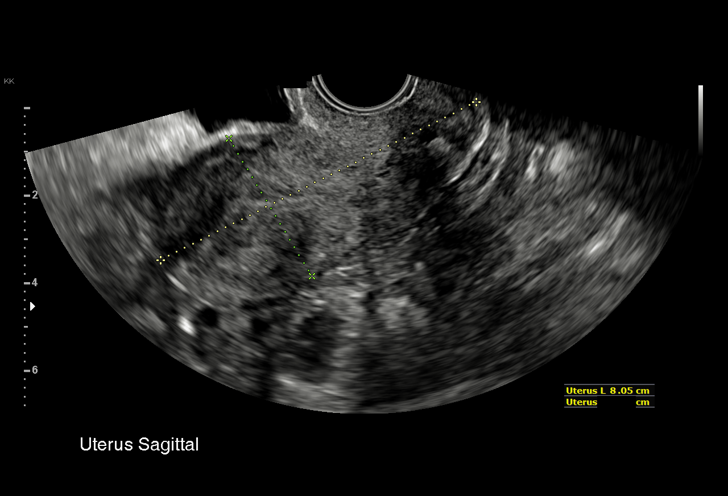
[im 44/106]
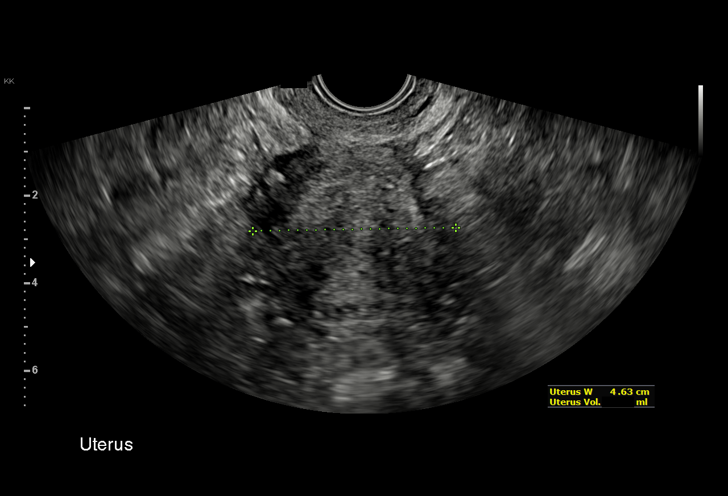
[im 53/106]
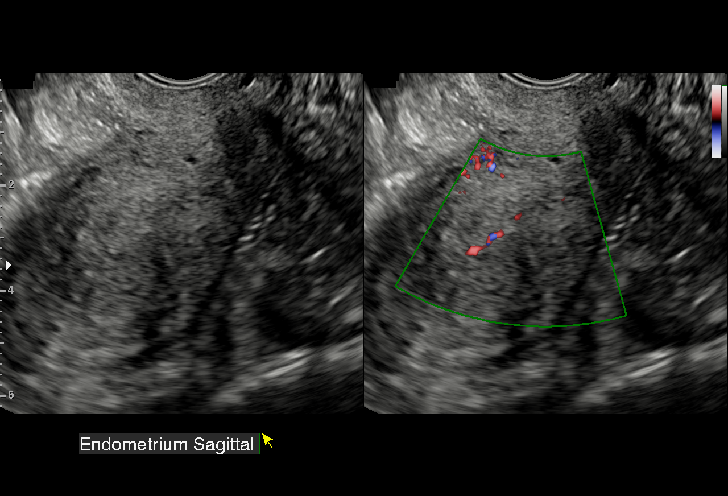
[im 62/106]
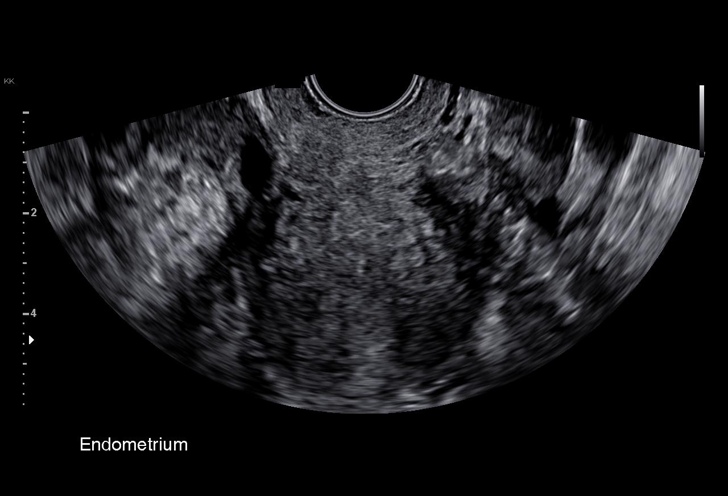
[im 66/106]
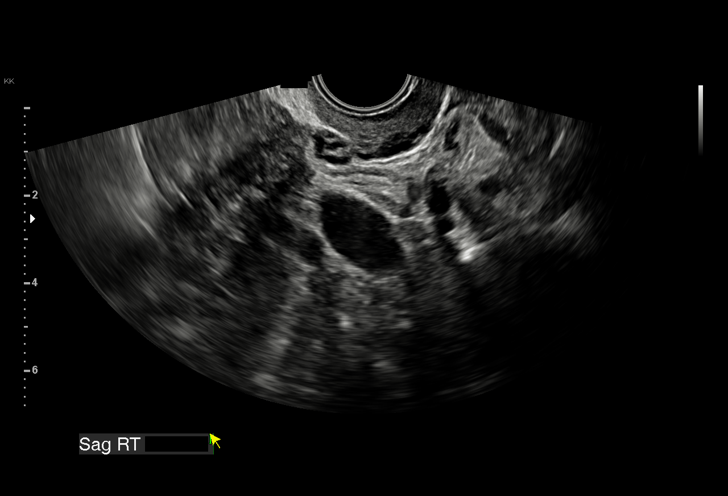
[im 75/106]
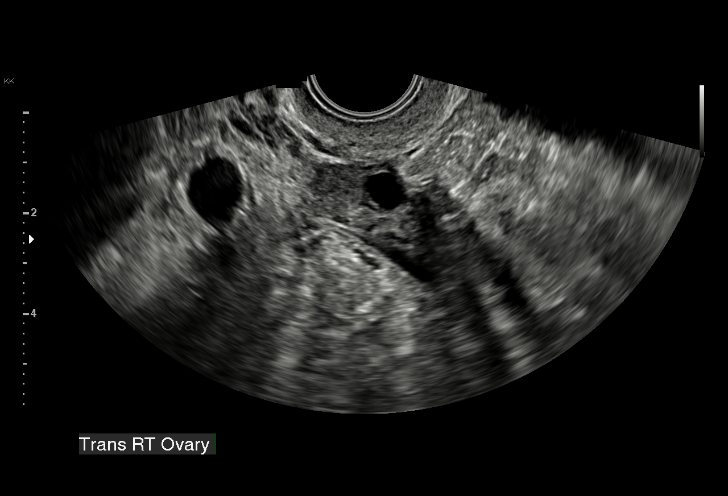
[im 84/106]
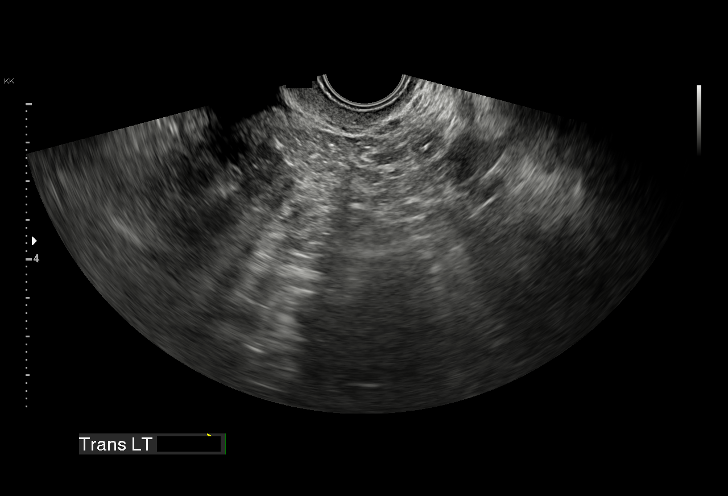
[im 88/106]
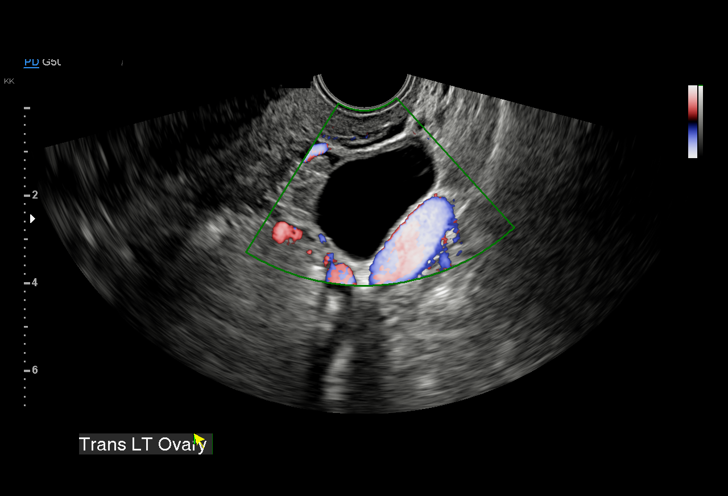
[im 97/106]
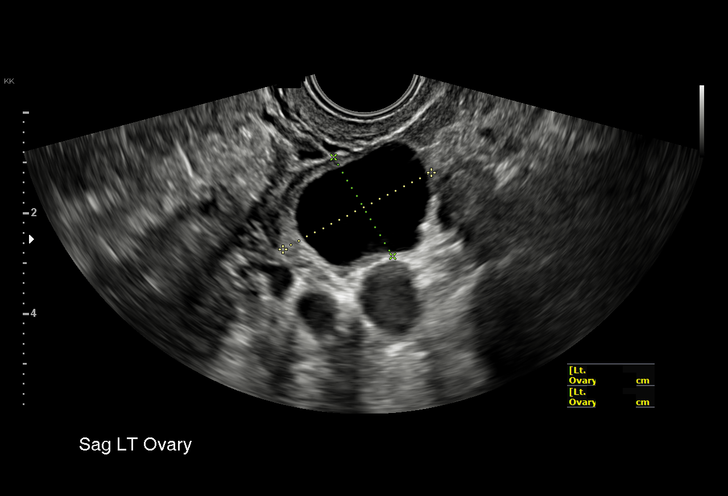
[im 106/106]
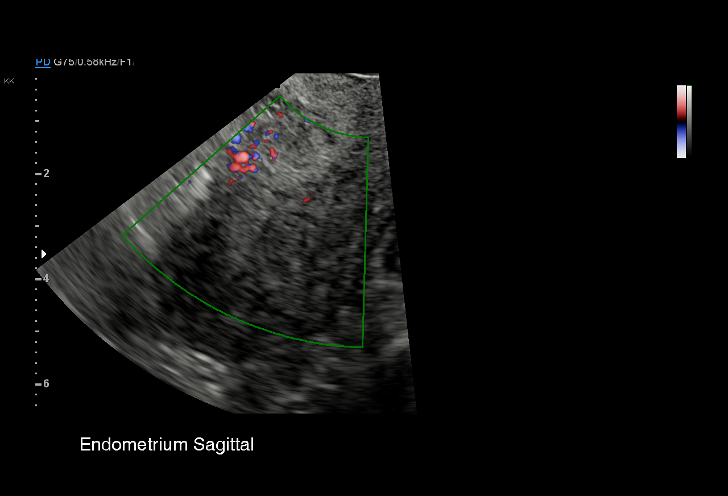

[15 of 25 positions shown; findings below may reference images not displayed]

FINDINGS: Uterus

Measurements: 8.1 x 3.7 x 4.6 cm = volume: 71 mL. Anteverted. Mildly
heterogeneous echogenicity. Questionable intramural leiomyoma at the
posterior mid uterus 16 x 14 x 16 mm.

Endometrium

Thickness: 6 mm. Question hypoechoic endometrial versus sub
endometrial nodule at the upper uterine segment 8 x 14 x 10 mm. No
endometrial fluid.

Right ovary

Measurements: 2.7 x 1.6 x 2.0 cm = volume: 4.5 mL. Normal morphology
without mass

Left ovary

Measurements: 3.3 x 2.3 x 3.1 cm = volume: 12.1 mL. Dominant
follicle without mass

Other findings

No free pelvic fluid.  No adnexal masses.
IMPRESSION: Probable small posterior intramural uterine leiomyoma 16 mm greatest
diameter.

Questionable endometrial versus sub endometrial nodule at the upper
uterine segment 8 x 14 x 10 mm; consider sonohysterogram for further
evaluation, prior to hysteroscopy or endometrial biopsy.

## 2019-11-11 ENCOUNTER — Encounter (HOSPITAL_COMMUNITY): Payer: Self-pay | Admitting: Emergency Medicine

## 2019-11-11 ENCOUNTER — Ambulatory Visit (HOSPITAL_COMMUNITY)
Admission: EM | Admit: 2019-11-11 | Discharge: 2019-11-11 | Disposition: A | Discharge status: Home / Self Care | Payer: 59 | Attending: Internal Medicine | Admitting: Internal Medicine

## 2019-11-11 ENCOUNTER — Ambulatory Visit (INDEPENDENT_AMBULATORY_CARE_PROVIDER_SITE_OTHER): Payer: 59

## 2019-11-11 ENCOUNTER — Other Ambulatory Visit: Payer: Self-pay

## 2019-11-11 DIAGNOSIS — R0602 Shortness of breath: Secondary | ICD-10-CM | POA: Diagnosis not present

## 2019-11-11 DIAGNOSIS — R059 Cough, unspecified: Secondary | ICD-10-CM | POA: Insufficient documentation

## 2019-11-11 DIAGNOSIS — B349 Viral infection, unspecified: Secondary | ICD-10-CM | POA: Diagnosis not present

## 2019-11-11 DIAGNOSIS — J189 Pneumonia, unspecified organism: Secondary | ICD-10-CM

## 2019-11-11 DIAGNOSIS — F17210 Nicotine dependence, cigarettes, uncomplicated: Secondary | ICD-10-CM | POA: Insufficient documentation

## 2019-11-11 DIAGNOSIS — R52 Pain, unspecified: Secondary | ICD-10-CM | POA: Diagnosis not present

## 2019-11-11 DIAGNOSIS — R5383 Other fatigue: Secondary | ICD-10-CM | POA: Diagnosis not present

## 2019-11-11 DIAGNOSIS — Z1152 Encounter for screening for COVID-19: Secondary | ICD-10-CM | POA: Insufficient documentation

## 2019-11-11 DIAGNOSIS — Z79899 Other long term (current) drug therapy: Secondary | ICD-10-CM | POA: Insufficient documentation

## 2019-11-11 DIAGNOSIS — J45909 Unspecified asthma, uncomplicated: Secondary | ICD-10-CM | POA: Insufficient documentation

## 2019-11-11 DIAGNOSIS — Z20822 Contact with and (suspected) exposure to covid-19: Secondary | ICD-10-CM | POA: Diagnosis not present

## 2019-11-11 MED ORDER — BENZONATATE 100 MG PO CAPS: 100.0000 mg | ORAL_CAPSULE | Freq: Three times a day (TID) | ORAL | 0 refills | Status: DC

## 2019-11-11 MED ORDER — PREDNISONE 10 MG (21) PO TBPK: ORAL_TABLET | Freq: Every day | ORAL | 0 refills | Status: AC

## 2019-11-11 MED ORDER — ALBUTEROL SULFATE HFA 108 (90 BASE) MCG/ACT IN AERS: 2.0000 | INHALATION_SPRAY | RESPIRATORY_TRACT | 0 refills | Status: DC | PRN

## 2019-11-11 NOTE — Discharge Instructions (Addendum)
Your chest x-ray is negative for pneumonia today  Your COVID test is pending.  You should self quarantine until the test result is back.    Take Tylenol as needed for fever or discomfort.  Rest and keep yourself hydrated.    Go to the emergency department if you develop acute worsening symptoms.

## 2019-11-11 NOTE — ED Triage Notes (Signed)
Pt presents with coughing, runny nose, SOB, headache xs 3 days. Denies any fever. States has hx of allergies and asthma.

## 2019-11-11 NOTE — ED Provider Notes (Signed)
[facility]   [id number] 11/11/19 Arrival Time: 1023   CC: COVID symptoms  SUBJECTIVE: History from: patient.  Justin Buechner is a 49 y.o. female who presents with abrupt onset of nasal congestion, PND, SOB, headache, and persistent dry cough for 3 days.  Denies sick exposure to COVID, flu or strep. Denies recent travel. Has negative history of Covid. Reports that she has asthma. Has completed Covid vaccines. Has  taken OTC medications for this without relief. There are no aggravating or alleviating factors. Denies previous symptoms in the past. Denies fever, chills, fatigue, sinus pain, wheezing, chest pain, nausea, changes in bowel or bladder habits.    ROS: As per HPI.  All other pertinent ROS negative.     Past Medical History:  Diagnosis Date  . Anxiety   . Asthma    Past Surgical History:  Procedure Laterality Date  . ANKLE SURGERY    . BELPHAROPTOSIS REPAIR    . SKIN GRAFT     No Known Allergies No current facility-administered medications on file prior to encounter.   Current Outpatient Medications on File Prior to Encounter  Medication Sig Dispense Refill  . dextromethorphan-guaiFENesin (MUCINEX DM) 30-600 MG 12hr tablet Take 1 tablet by mouth 2 (two) times daily. 20 tablet 0  . montelukast (SINGULAIR) 10 MG tablet Take 1 tablet (10 mg total) by mouth at bedtime. 30 tablet 0  . [DISCONTINUED] cetirizine (ZYRTEC ALLERGY) 10 MG tablet Take 1 tablet (10 mg total) by mouth daily. 90 tablet 1  . [DISCONTINUED] fluticasone (FLONASE) 50 MCG/ACT nasal spray Place 2 sprays into both nostrils daily. (Patient not taking: Reported on 11/21/2018) 1 g 0  . [DISCONTINUED] ipratropium (ATROVENT) 0.06 % nasal spray Place 2 sprays into both nostrils 4 (four) times daily. (Patient not taking: Reported on 11/21/2018) 15 mL 0  . [DISCONTINUED] loratadine (CLARITIN) 10 MG tablet Take 1 tablet (10 mg total) by mouth daily. (Patient not taking: Reported on 11/21/2018) 90 tablet 1    Social History   Socioeconomic History  . Marital status: Single    Spouse name: Not on file  . Number of children: Not on file  . Years of education: Not on file  . Highest education level: Not on file  Occupational History  . Not on file  Tobacco Use  . Smoking status: Current Some Day Smoker    Packs/day: 0.50    Types: Cigarettes  . Smokeless tobacco: Never Used  Vaping Use  . Vaping Use: Never used  Substance and Sexual Activity  . Alcohol use: No  . Drug use: No  . Sexual activity: Not on file  Other Topics Concern  . Not on file  Social History Narrative  . Not on file   Social Determinants of Health   Financial Resource Strain:   . Difficulty of Paying Living Expenses: Not on file  Food Insecurity:   . Worried About Programme researcher, broadcasting/film/video in the Last Year: Not on file  . Ran Out of Food in the Last Year: Not on file  Transportation Needs:   . Lack of Transportation (Medical): Not on file  . Lack of Transportation (Non-Medical): Not on file  Physical Activity:   . Days of Exercise per Week: Not on file  . Minutes of Exercise per Session: Not on file  Stress:   . Feeling of Stress : Not on file  Social Connections:   . Frequency of Communication with Friends and Family: Not on file  . Frequency  of Social Gatherings with Friends and Family: Not on file  . Attends Religious Services: Not on file  . Active Member of Clubs or Organizations: Not on file  . Attends Banker Meetings: Not on file  . Marital Status: Not on file  Intimate Partner Violence:   . Fear of Current or Ex-Partner: Not on file  . Emotionally Abused: Not on file  . Physically Abused: Not on file  . Sexually Abused: Not on file   Family History  Problem Relation Age of Onset  . Diabetes Father     OBJECTIVE:  Vitals:   11/11/19 1142  BP: 121/79  Pulse: 84  Resp: 17  Temp: 98.1 F (36.7 C)  TempSrc: Oral  SpO2: 100%     General appearance: alert; appears  fatigued, but nontoxic; speaking in full sentences and tolerating own secretions HEENT: NCAT; Ears: EACs clear, TMs pearly gray; Eyes: PERRL.  EOM grossly intact. Sinuses: nontender; Nose: nares patent without rhinorrhea, Throat: oropharynx clear, tonsils non erythematous or enlarged, uvula midline  Neck: supple without LAD Lungs: unlabored respirations, symmetrical air entry; cough: mild; no respiratory distress; diminished lung sounds to bilateral lower lobes Heart: regular rate and rhythm.  Radial pulses 2+ symmetrical bilaterally Skin: warm and dry Psychological: alert and cooperative; normal mood and affect  LABS:  No results found for this or any previous visit (from the past 24 hour(s)).   ASSESSMENT & PLAN:  1. Viral illness   2. Cough   3. Other fatigue   4. Aches   5. SOB (shortness of breath)   6. Encounter for screening for COVID-19     Meds ordered this encounter  Medications  . predniSONE (STERAPRED UNI-PAK 21 TAB) 10 MG (21) TBPK tablet    Sig: Take by mouth daily for 6 days. Take 6 tablets on day 1, 5 tablets on day 2, 4 tablets on day 3, 3 tablets on day 4, 2 tablets on day 5, 1 tablet on day 6    Dispense:  21 tablet    Refill:  0    Order Specific Question:   Supervising Provider    Answer:   Merrilee Jansky [id number]  . benzonatate (TESSALON) 100 MG capsule    Sig: Take 1 capsule (100 mg total) by mouth every 8 (eight) hours.    Dispense:  21 capsule    Refill:  0    Order Specific Question:   Supervising Provider    Answer:   Merrilee Jansky [id number]  . albuterol (VENTOLIN HFA) 108 (90 Base) MCG/ACT inhaler    Sig: Inhale 2 puffs into the lungs every 4 (four) hours as needed for wheezing or shortness of breath.    Dispense:  18 g    Refill:  0    Order Specific Question:   Supervising Provider    Answer:   Merrilee Jansky [id number]    Chest xray is negative Prescribed steroid taper Prescribed tessalon perles Prescribed albuterol  inhaler  COVID testing ordered.  It will take between 1-2 days for test results.  Someone will contact you regarding abnormal results.    Patient should remain in quarantine until they have received Covid results.  If negative you may resume normal activities (go back to work/school) while practicing hand hygiene, social distance, and mask wearing.  If positive, patient should remain in quarantine for 10 days from symptom onset AND greater than 72 hours after symptoms resolution (absence of fever without the use of  fever-reducing medication and improvement in respiratory symptoms), whichever is longer Get plenty of rest and push fluids Use OTC zyrtec for nasal congestion, runny nose, and/or sore throat Use OTC flonase for nasal congestion and runny nose Use medications daily for symptom relief Use OTC medications like ibuprofen or tylenol as needed fever or pain Call or go to the ED if you have any new or worsening symptoms such as fever, worsening cough, shortness of breath, chest tightness, chest pain, turning blue, changes in mental status.  Reviewed expectations re: course of current medical issues. Questions answered. Outlined signs and symptoms indicating need for more acute intervention. Patient verbalized understanding. After Visit Summary given.         Moshe Cipro, NP 11/11/19 1309

## 2019-11-12 LAB — SARS CORONAVIRUS 2 (TAT 6-24 HRS): SARS Coronavirus 2: NEGATIVE

## 2020-02-14 IMAGING — DX DG CHEST 2V
2 series · 2 of 2 positions shown · non-contrast
Comparison: 05/22/2017

CLINICAL DATA: Cough, wheezing, chest pressure

EXAM:
CHEST - 2 VIEW

[chest pa]
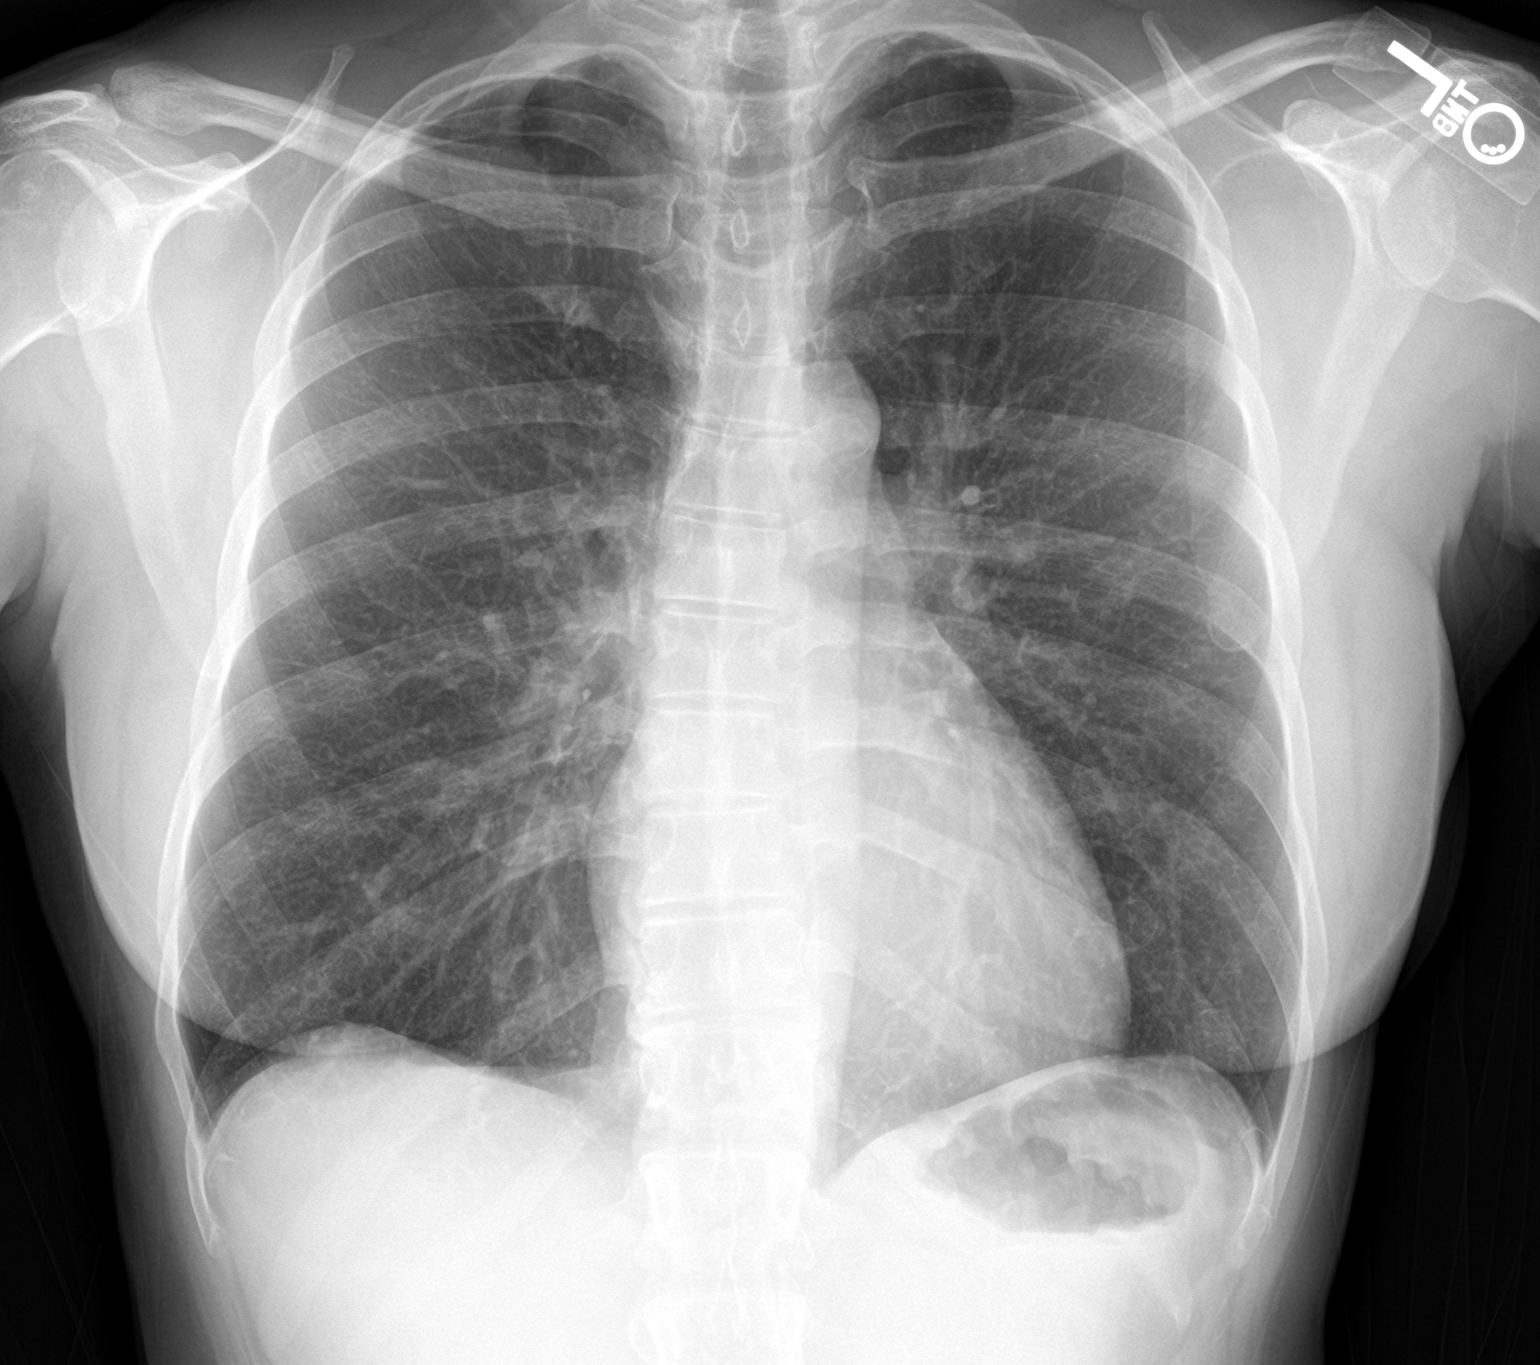

[chest lat]
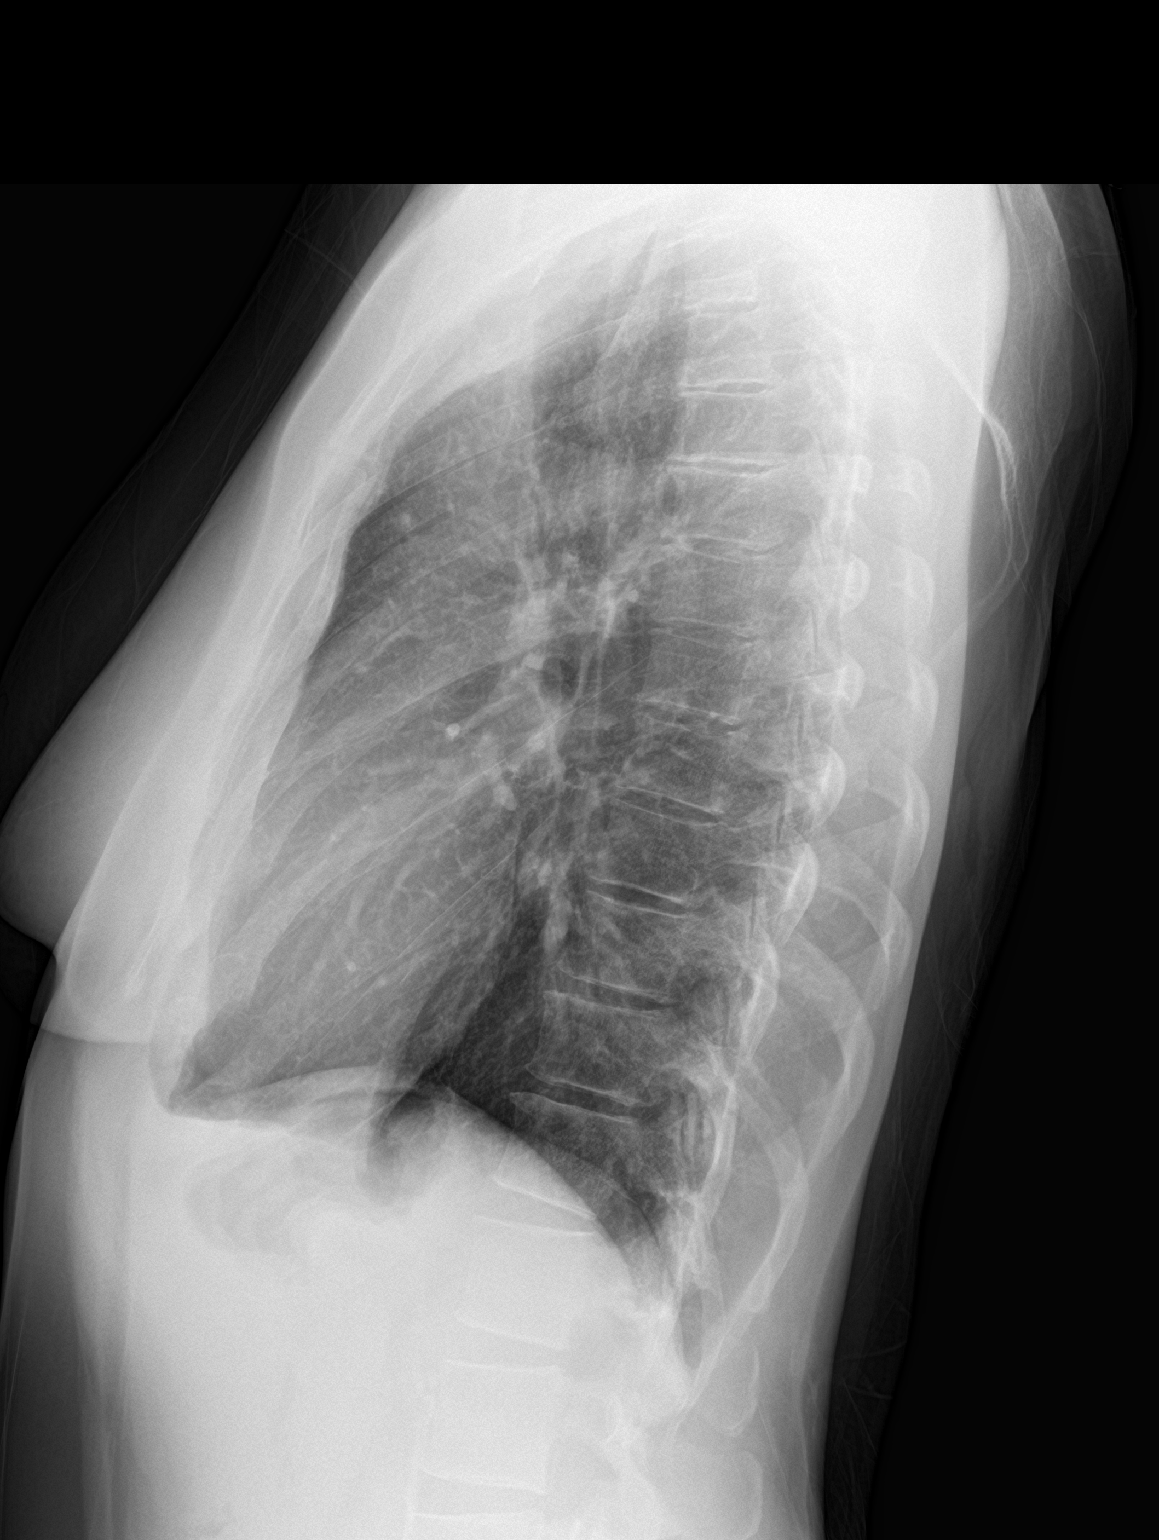

[2 of 2 positions shown; findings below may reference images not displayed]

FINDINGS: The heart size and mediastinal contours are within normal limits.
Both lungs are clear. The visualized skeletal structures are
unremarkable.
IMPRESSION: No acute abnormality of the lungs.

## 2020-04-20 ENCOUNTER — Telehealth: Payer: 59 | Admitting: Family

## 2020-04-20 ENCOUNTER — Encounter: Payer: Self-pay | Admitting: Family

## 2020-04-20 DIAGNOSIS — R238 Other skin changes: Secondary | ICD-10-CM | POA: Diagnosis not present

## 2020-04-20 DIAGNOSIS — L259 Unspecified contact dermatitis, unspecified cause: Secondary | ICD-10-CM

## 2020-04-20 MED ORDER — TRIAMCINOLONE ACETONIDE 0.5 % EX OINT: 1.0000 "application " | TOPICAL_OINTMENT | Freq: Two times a day (BID) | CUTANEOUS | 0 refills | Status: DC

## 2020-04-20 NOTE — Progress Notes (Signed)
   Virtual Visit via telephone Note Due to COVID-19 pandemic this visit was conducted virtually. This visit type was conducted due to national recommendations for restrictions regarding the COVID-19 Pandemic (e.g. social distancing, sheltering in place) in an effort to limit this patient's exposure and mitigate transmission in our community. All issues noted in this document were discussed and addressed.  A physical exam was not performed with this format.  I connected with Yolanda Norris on 04/20/20 at 6:14 pm  by video and verified that I am speaking with the correct person using two identifiers. Yolanda Norris is currently located at home and no one  is currently with her during visit. The provider, Jannifer Rodney, FNP is located in their office at time of visit.  I discussed the limitations, risks, security and privacy concerns of performing an evaluation and management service by telephone and the availability of in person appointments. I also discussed with the patient that there may be a patient responsible charge related to this service. The patient expressed understanding and agreed to proceed.   History and Present Illness:  HPI Pt calls the office today with scalp irration that started after she did at home perm three days ago. She states she woke up the next morning she felt a burning sensation on her left neck and above left ear. She reports the burning pain is intermittent 6-8 out 10. She has been taking BC powder.   Review of Systems  Skin: Positive for itching and rash.  All other systems reviewed and are negative.    Observations/Objective: No SOB or distress noted, mild irration on left lower neck    Assessment and Plan: 1. Contact dermatitis, unspecified contact dermatitis type, unspecified trigger - triamcinolone ointment (KENALOG) 0.5 %; Apply 1 application topically 2 (two) times daily.  Dispense: 60 g; Refill: 0  2. Scalp irritation - triamcinolone ointment  (KENALOG) 0.5 %; Apply 1 application topically 2 (two) times daily.  Dispense: 60 g; Refill: 0  Apply Kenalog cream twice a day Cool compresses  Avoid harsh chemicals  Do not scratch  Report any increased swelling, tenderness, or discharge    I discussed the assessment and treatment plan with the patient. The patient was provided an opportunity to ask questions and all were answered. The patient agreed with the plan and demonstrated an understanding of the instructions.   The patient was advised to call back or seek an in-person evaluation if the symptoms worsen or if the condition fails to improve as anticipated.  The above assessment and management plan was discussed with the patient. The patient verbalized understanding of and has agreed to the management plan. Patient is aware to call the clinic if symptoms persist or worsen. Patient is aware when to return to the clinic for a follow-up visit. Patient educated on when it is appropriate to go to the emergency department.   Time call ended:  6:28 pm   I provided 14 minutes of non-face-to-face time during this encounter.    Jannifer Rodney, FNP

## 2020-04-25 ENCOUNTER — Other Ambulatory Visit: Payer: Self-pay

## 2020-04-25 ENCOUNTER — Ambulatory Visit (HOSPITAL_COMMUNITY)
Admission: EM | Admit: 2020-04-25 | Discharge: 2020-04-25 | Disposition: A | Discharge status: Home / Self Care | Payer: 59 | Attending: Emergency Medicine | Admitting: Emergency Medicine

## 2020-04-25 ENCOUNTER — Encounter (HOSPITAL_COMMUNITY): Payer: Self-pay | Admitting: Emergency Medicine

## 2020-04-25 DIAGNOSIS — T7840XA Allergy, unspecified, initial encounter: Secondary | ICD-10-CM | POA: Diagnosis not present

## 2020-04-25 MED ORDER — PREDNISONE 10 MG (21) PO TBPK: ORAL_TABLET | Freq: Every day | ORAL | 0 refills | Status: DC

## 2020-04-25 NOTE — ED Triage Notes (Signed)
Patient c/o scalp swelling and pain x 1 week.   Patient endorses onset of symptoms began after washing hair with shampoo after this patient began having "swelling to the back of head,. Itching, and burning, it feels like somebody pulled my hair out my scalp".   Patient endorses having a telehealth visit with another provider where a steroid cream was prescribed for "allergic reaction", patient states that "I'm not sure if the cream helped".   Patient denies any hair come out of the affected area.   Patient endorses scabbing occurring to area.

## 2020-04-25 NOTE — Discharge Instructions (Signed)
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 PPD is commonly found in hair dye. Please rinse your hair with hydrogen peroxide tonight after shampooing. You can apply a mixture of all of oil and lime juice to areas of scalp that are tender at night before bed. Please take tapered steroid as directed.

## 2020-04-25 NOTE — ED Provider Notes (Signed)
[facility]  ____________________________________________  Time seen: Approximately 9:15 AM  I have reviewed the triage vital signs and the nursing notes.   HISTORY  Chief Complaint Hair/Scalp Problem   Historian Patient     HPI Yolanda Norris is a 50 y.o. female presents to the urgent care with a tender scalp and a ulcerated rash along skin above left ear that developed after patient dyed her hair at home.  Patient states that she initially had pain along her neck which had come in contact with hair dye which is since improved.  Patient states that she has been using a steroid cream which is helped some but her symptoms have not resolved and she became concerned.  She denies fever and chills.  Rash is not in a dermatomal distribution.  Patient denies a known history of PPD allergy.    Past Medical History:  Diagnosis Date  . Anxiety   . Asthma      Immunizations up to date:  Yes.     Past Medical History:  Diagnosis Date  . Anxiety   . Asthma     There are no problems to display for this patient.   Past Surgical History:  Procedure Laterality Date  . ANKLE SURGERY    . BELPHAROPTOSIS REPAIR    . SKIN GRAFT      Prior to Admission medications   Medication Sig Start Date End Date Taking? Authorizing Provider  predniSONE (STERAPRED UNI-PAK 21 TAB) 10 MG (21) TBPK tablet Take by mouth daily. 6,5,4,3,2,1 04/25/20  Yes Pia Mau M, PA-C  triamcinolone ointment (KENALOG) 0.5 % Apply 1 application topically 2 (two) times daily. 04/20/20  Yes Hawks, Christy A, FNP  albuterol (VENTOLIN HFA) 108 (90 Base) MCG/ACT inhaler Inhale 2 puffs into the lungs every 4 (four) hours as needed for wheezing or shortness of breath. 11/11/19   Moshe Cipro, NP  benzonatate (TESSALON) 100 MG capsule Take 1 capsule (100 mg total) by mouth every 8 (eight) hours. 11/11/19   Moshe Cipro, NP  dextromethorphan-guaiFENesin [facility] DM) 30-600 MG 12hr tablet Take  1 tablet by mouth 2 (two) times daily. 12/09/18   Wieters, Hallie C, PA-C  montelukast (SINGULAIR) 10 MG tablet Take 1 tablet (10 mg total) by mouth at bedtime. 07/16/19   Mardella Layman, MD  cetirizine (ZYRTEC ALLERGY) 10 MG tablet Take 1 tablet (10 mg total) by mouth daily. 05/22/17 08/19/18  Wallis Bamberg, PA-C  fluticasone (FLONASE) 50 MCG/ACT nasal spray Place 2 sprays into both nostrils daily. Patient not taking: Reported on 11/21/2018 08/22/17 11/21/18  Belinda Fisher, PA-C  ipratropium (ATROVENT) 0.06 % nasal spray Place 2 sprays into both nostrils 4 (four) times daily. Patient not taking: Reported on 11/21/2018 08/22/17 11/21/18  Belinda Fisher, PA-C  loratadine (CLARITIN) 10 MG tablet Take 1 tablet (10 mg total) by mouth daily. Patient not taking: Reported on 11/21/2018 05/22/17 12/09/18  Wallis Bamberg, PA-C    Allergies Patient has no known allergies.  Family History  Problem Relation Age of Onset  . Diabetes Father     Social History Social History   Tobacco Use  . Smoking status: Current Some Day Smoker    Packs/day: 0.50    Types: Cigarettes  . Smokeless tobacco: Never Used  Vaping Use  . Vaping Use: Never used  Substance Use Topics  . Alcohol use: No  . Drug use: No     Review of Systems  Constitutional: No fever/chills Eyes:  No discharge ENT: No  upper respiratory complaints. Respiratory: no cough. No SOB/ use of accessory muscles to breath Gastrointestinal:   No nausea, no vomiting.  No diarrhea.  No constipation. Musculoskeletal: Negative for musculoskeletal pain. 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     ____________________________________________   PHYSICAL EXAM:  VITAL SIGNS: ED Triage Vitals  Enc Vitals Group     BP 04/25/20 0851 121/76     Pulse Rate 04/25/20 0851 75     Resp 04/25/20 0851 16     Temp 04/25/20 0851 98.5 F (36.9 C)     Temp Source 04/25/20 0851 Oral     SpO2 04/25/20 0851 98 %     Weight --      Height --      Head Circumference --      Peak  Flow --      Pain Score 04/25/20 0848 7     Pain Loc --      Pain Edu? --      Excl. in GC? --      Constitutional: Alert and oriented. Well appearing and in no acute distress. Eyes: Conjunctivae are normal. PERRL. EOMI. Head: Atraumatic.  Patient has a macerated and erythematous rash of the skin above left ear.  She also has some mild boggy edema of the occipital scalp without vesicle formation.  No signs of hair loss. ENT:      Nose: No congestion/rhinnorhea.      Mouth/Throat: Mucous membranes are moist.  Neck: No stridor.  No cervical spine tenderness to palpation. Cardiovascular: Normal rate, regular rhythm. Normal S1 and S2.  Good peripheral circulation. Respiratory: Normal respiratory effort without tachypnea or retractions. Lungs CTAB. Good air entry to the bases with no decreased or absent breath sounds Gastrointestinal: Bowel sounds x 4 quadrants. Soft and nontender to palpation. No guarding or rigidity. No distention. Musculoskeletal: Full range of motion to all extremities. No obvious deformities noted Neurologic:  Normal for age. No gross focal neurologic deficits are appreciated.  Skin:  Skin is warm, dry and intact. No rash noted. Psychiatric: Mood and affect are normal for age. Speech and behavior are normal.   ____________________________________________   LABS (all labs ordered are listed, but only abnormal results are displayed)  Labs Reviewed - No data to display ____________________________________________  EKG   ____________________________________________  RADIOLOGY  No results found.  ____________________________________________    PROCEDURES  Procedure(s) performed:     Procedures     Medications - No data to display   ____________________________________________   INITIAL IMPRESSION / ASSESSMENT AND PLAN / ED COURSE  Pertinent labs & imaging results that were available during my care of the patient were reviewed by me and  considered in my medical decision making (see chart for details).      Assessment and plan PPD allergy 50 year old female presents to the urgent care with scalp rash and tenderness.  Vital signs are reassuring at triage.  Physical exam findings suggested a PPD allergy.  Recommended shampooing hair is normal and rinsing scalp with hydrogen peroxide.  Also recommended a topical application of lime juice and all of oil at night.  Patient was placed on a systemic steroid, prednisone.  Return precautions were given to return if symptoms do not improve.  All patient questions were answered.     ____________________________________________  FINAL CLINICAL IMPRESSION(S) / ED DIAGNOSES  Final diagnoses:  Allergic reaction, initial encounter      NEW MEDICATIONS STARTED DURING THIS VISIT:  ED Discharge Orders  Ordered    predniSONE (STERAPRED UNI-PAK 21 TAB) 10 MG (21) TBPK tablet  Daily        04/25/20 0912              This chart was dictated using voice recognition software/Dragon. Despite best efforts to proofread, errors can occur which can change the meaning. Any change was purely unintentional.     Orvil Feil, [place] 04/25/20 [phone]

## 2020-09-18 ENCOUNTER — Encounter (HOSPITAL_COMMUNITY): Payer: Self-pay

## 2020-09-18 ENCOUNTER — Other Ambulatory Visit: Payer: Self-pay

## 2020-09-18 ENCOUNTER — Ambulatory Visit (HOSPITAL_COMMUNITY)
Admission: EM | Admit: 2020-09-18 | Discharge: 2020-09-18 | Disposition: A | Discharge status: Home / Self Care | Payer: 59 | Attending: Emergency Medicine | Admitting: Emergency Medicine

## 2020-09-18 DIAGNOSIS — M778 Other enthesopathies, not elsewhere classified: Secondary | ICD-10-CM | POA: Diagnosis present

## 2020-09-18 DIAGNOSIS — R82998 Other abnormal findings in urine: Secondary | ICD-10-CM | POA: Insufficient documentation

## 2020-09-18 LAB — POCT URINALYSIS DIPSTICK, ED / UC
Glucose, UA: NEGATIVE mg/dL
Hgb urine dipstick: NEGATIVE
Ketones, ur: NEGATIVE mg/dL
Leukocytes,Ua: NEGATIVE
Nitrite: NEGATIVE
Protein, ur: NEGATIVE mg/dL
Specific Gravity, Urine: >=1.03 (ref 1.005–1.030)
Urobilinogen, UA: 1 mg/dL (ref 0.0–1.0)
pH: 6 (ref 5.0–8.0)

## 2020-09-18 LAB — BASIC METABOLIC PANEL
Anion gap: 9 (ref 5–15)
BUN: 7 mg/dL (ref 6–20)
CO2: 28 mmol/L (ref 22–32)
Calcium: 10 mg/dL (ref 8.9–10.3)
Chloride: 104 mmol/L (ref 98–111)
Creatinine, Ser: 0.84 mg/dL (ref 0.44–1.00)
GFR, Estimated: >60 mL/min (ref >60)
Glucose, Bld: 110 mg/dL — ABNORMAL HIGH (ref 70–99)
Potassium: 4.8 mmol/L (ref 3.5–5.1)
Sodium: 141 mmol/L (ref 135–145)

## 2020-09-18 MED ORDER — PREDNISONE 20 MG PO TABS: 40.0000 mg | ORAL_TABLET | Freq: Every day | ORAL | 0 refills | Status: DC

## 2020-09-18 MED ORDER — MELOXICAM 7.5 MG PO TABS: 7.5000 mg | ORAL_TABLET | Freq: Every day | ORAL | 0 refills | Status: DC

## 2020-09-18 NOTE — ED Provider Notes (Signed)
[facility]    CSN: [id number] Arrival date & time: 09/18/20  4496      History   Chief Complaint Chief Complaint  Patient presents with   Urinary Tract Infection    HPI Yolanda Norris is a 50 y.o. female.   Presents with lump medial flexor of left with left forearm pain present over the last week, worsening as time progresses.  Pain worsened with extension of arm, pain is completely resolved with flexion of the elbow and arm held close to the body.  Pain radiates down the posterior aspect forearm.  Numbness and tingling felt in the left pinky when pain is present.  Denies prior injury, trauma.   Concerned with dark-colored urine present is moderate 1 to 2 weeks, described as tea colored.  Denies urinary frequency, urgency, abdominal pain, flank pain, vaginal irritation, vaginal discharge, odor, itching.  Not sexually active.  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   Past Surgical History:  Procedure Laterality Date   ANKLE SURGERY     BELPHAROPTOSIS REPAIR     SKIN GRAFT      OB History   No obstetric history on file.      Home Medications    Prior to Admission medications   Medication Sig Start Date End Date Taking? Authorizing Provider  meloxicam (MOBIC) 7.5 MG tablet Take 1 tablet (7.5 mg total) by mouth daily. 09/18/20  Yes Adhrit Krenz R, NP  predniSONE (DELTASONE) 20 MG tablet Take 2 tablets (40 mg total) by mouth daily. 09/18/20  Yes Panzy Bubeck R, NP  albuterol (VENTOLIN HFA) 108 (90 Base) MCG/ACT inhaler Inhale 2 puffs into the lungs every 4 (four) hours as needed for wheezing or shortness of breath. 11/11/19   Moshe Cipro, NP  benzonatate (TESSALON) 100 MG capsule Take 1 capsule (100 mg total) by mouth every 8 (eight) hours. 11/11/19   Moshe Cipro, NP  dextromethorphan-guaiFENesin [facility] DM) 30-600 MG 12hr tablet Take 1 tablet by mouth 2 (two) times daily. 12/09/18   Wieters, Hallie C, PA-C  montelukast (SINGULAIR) 10 MG tablet Take 1 tablet (10 mg total) by mouth at bedtime. 07/16/19   Mardella Layman, MD  triamcinolone ointment (KENALOG) 0.5 % Apply 1 application topically 2 (two) times daily. 04/20/20   Junie Spencer, FNP  cetirizine (ZYRTEC ALLERGY) 10 MG tablet Take 1 tablet (10 mg total) by mouth daily. 05/22/17 08/19/18  Wallis Bamberg, PA-C  fluticasone (FLONASE) 50 MCG/ACT nasal spray Place 2 sprays into both nostrils daily. Patient not taking: Reported on 11/21/2018 08/22/17 11/21/18  Belinda Fisher, PA-C  ipratropium (ATROVENT) 0.06 % nasal spray Place 2 sprays into both nostrils 4 (four) times daily. Patient not taking: Reported on 11/21/2018 08/22/17 11/21/18  Belinda Fisher, PA-C  loratadine (CLARITIN) 10 MG tablet Take 1 tablet (10 mg total) by mouth daily. Patient not taking: Reported on 11/21/2018 05/22/17 12/09/18  Wallis Bamberg, PA-C    Family History Family History  Problem Relation Age of Onset   Diabetes Father     Social History Social History   Tobacco Use   Smoking status: Some Days    Packs/day: 0.50    Types: Cigarettes   Smokeless tobacco: Never  Vaping Use   Vaping Use: Never used  Substance Use Topics   Alcohol use: No   Drug use: No     Allergies   Patient has no known allergies.   Review of Systems Review of Systems Defer to HPI    Physical Exam Triage Vital Signs ED Triage Vitals  Enc Vitals Group     BP 09/18/20 0922 124/80     Pulse Rate 09/18/20 0922 77     Resp 09/18/20 0922 17     Temp 09/18/20 0922 98.3 F (36.8 C)     Temp Source 09/18/20 0922 Oral     SpO2 09/18/20 0922 97 %     Weight --      Height --      Head Circumference --      Peak Flow --      Pain Score 09/18/20 0921 0     Pain Loc --      Pain Edu? --      Excl. in [place]? --    No data found.  Updated  Vital Signs BP 124/80 (BP Location: Left Arm)   Pulse 77   Temp 98.3 F (36.8 C) (Oral)   Resp 17   SpO2 97%   Visual Acuity Right Eye Distance:   Left Eye Distance:   Bilateral Distance:    Right Eye Near:   Left Eye Near:    Bilateral Near:     Physical Exam Constitutional:      Appearance: Normal appearance.  Eyes:     Extraocular Movements: Extraocular movements intact.  Pulmonary:     Effort: Pulmonary effort is normal.  Abdominal:     General: Abdomen is flat. Bowel sounds are normal. There is no distension.     Palpations: Abdomen is soft.     Tenderness: There is no abdominal tenderness. There is no guarding.  Genitourinary:    Comments: Deferred self collected vaginal swab Musculoskeletal:       Arms:     Comments: Swelling present the medial aspect of the flexor, point tenderness over the olecranon, range of motion intact but pain elicited with extension of arm    Skin:    General: Skin is warm and dry.  Neurological:     Mental Status: She is alert and oriented to person, place, and time. Mental status is at baseline.  Psychiatric:        Mood and Affect: Mood normal.        Behavior: Behavior normal.     UC Treatments / Results  Labs (all labs ordered are listed, but only abnormal results are displayed) Labs Reviewed  BASIC METABOLIC PANEL - Abnormal; Notable for the following components:      Result Value   Glucose, Bld 110 (*)    All other components within normal limits  POCT URINALYSIS DIPSTICK, ED / UC - Abnormal; Notable for the following components:   Bilirubin Urine SMALL (*)    All other components within normal limits    EKG   Radiology No results found.  Procedures Procedures (including critical care time)  Medications Ordered in UC Medications - No data to display  Initial Impression / Assessment and Plan / UC Course  I have reviewed the triage vital signs and the nursing notes.  Pertinent labs & imaging results that  were available during my care of the patient were reviewed by me and considered in my medical decision making (see chart for details).  Clinical Course as of 09/18/20 1921  Thu Sep 18, 2020  3154 Bilirubin Urine(!): SMALL [AW]    Clinical Course User Index [AW] Valinda Hoar, NP    Left elbow tendinitis Dark urine  Etiology of dark urine unknown at this time, discussed with patient we will try to rule out overt causes, will monitor able to find and have her follow-up with primary care or urgent care for persistent symptoms, verbalized understanding and agreement with plan  1.  Prednisone 40 mg daily for 5 days 2.  Meloxicam 7.5 mg daily as needed after completion of steroids 3.  Heating pad in 15-minute intervals for additional comfort 4.  Urinalysis positive for bilirubin negative for infection 5.  BMP to assess liver enzymes, will notify patient of concerning lab values for follow-up with his PCP 6.  STI screening for yeast and BV, will treat per protocol if positive 7.  PCP referral placed to establish care 8.  Orthopedic follow-up for persistent or recurring tendinitis Final Clinical Impressions(s) / UC Diagnoses   Final diagnoses:  Left elbow tendonitis  Dark urine     Discharge Instructions      Your symptoms are most likely related to a form of tendinitis meaning that there is an inflammatory process going on that is causing the pain  Take prednisone every morning with food for the next 5 days once prednisone is completed can use meloxicam once every morning as needed if needing additional comfort while taking prednisone can use over-the-counter Tylenol throughout the  Can use heating pad in 15-minute intervals for additional comfort over affected area  Your urinalysis did not show any signs of infection however there was a small amount of bilirubin present this typically is not an indication of anything serious however I do want to check your liver labs, these labs  can take up to 24 hours to return there for you will be called for any concerning values  A primary care referral has been placed for you so that she may establish care and for follow-up for anything seen in the urgent care  You may follow-up with orthopedic specialist for your arm pain, they can provide long-term management and other resources for treatment   ED Prescriptions     Medication Sig Dispense Auth. Provider   predniSONE (DELTASONE) 20 MG tablet Take 2 tablets (40 mg total) by mouth daily. 10 tablet Dajsha Massaro, Hansel Starling R, NP   meloxicam (MOBIC) 7.5 MG tablet Take 1 tablet (7.5 mg total) by mouth daily. 30 tablet Valinda Hoar, NP      PDMP not reviewed this encounter.   Valinda Hoar, NP 09/18/20 1928

## 2020-09-18 NOTE — ED Triage Notes (Signed)
Pt presents with c/o a lump on the left arm.   States her urine is dark. Denies back and abdominal pain. Denies urinary issues.

## 2020-09-18 NOTE — Discharge Instructions (Addendum)
Your symptoms are most likely related to a form of tendinitis meaning that there is an inflammatory process going on that is causing the pain  Take prednisone every morning with food for the next 5 days once prednisone is completed can use meloxicam once every morning as needed if needing additional comfort while taking prednisone can use over-the-counter Tylenol throughout the  Can use heating pad in 15-minute intervals for additional comfort over affected area  Your urinalysis did not show any signs of infection however there was a small amount of bilirubin present this typically is not an indication of anything serious however I do want to check your liver labs, these labs can take up to 24 hours to return there for you will be called for any concerning values  A primary care referral has been placed for you so that she may establish care and for follow-up for anything seen in the urgent care  You may follow-up with orthopedic specialist for your arm pain, they can provide long-term management and other resources for treatment

## 2020-12-26 ENCOUNTER — Ambulatory Visit (HOSPITAL_COMMUNITY)
Admission: EM | Admit: 2020-12-26 | Discharge: 2020-12-26 | Disposition: A | Discharge status: Home / Self Care | Payer: 59 | Attending: Nurse Practitioner | Admitting: Nurse Practitioner

## 2020-12-26 ENCOUNTER — Encounter (HOSPITAL_COMMUNITY): Payer: Self-pay

## 2020-12-26 ENCOUNTER — Other Ambulatory Visit: Payer: Self-pay

## 2020-12-26 DIAGNOSIS — S39012A Strain of muscle, fascia and tendon of lower back, initial encounter: Secondary | ICD-10-CM

## 2020-12-26 MED ORDER — NAPROXEN 500 MG PO TABS: 500.0000 mg | ORAL_TABLET | Freq: Two times a day (BID) | ORAL | 0 refills | Status: DC

## 2020-12-26 MED ORDER — KETOROLAC TROMETHAMINE 60 MG/2ML IM SOLN
60.0000 mg | Freq: Once | INTRAMUSCULAR | Status: AC
  Administered 2020-12-26: 60 mg via INTRAMUSCULAR

## 2020-12-26 MED ORDER — DEXAMETHASONE SODIUM PHOSPHATE 10 MG/ML IJ SOLN
INTRAMUSCULAR | Status: AC
  Filled 2020-12-26: qty 1

## 2020-12-26 MED ORDER — KETOROLAC TROMETHAMINE 30 MG/ML IJ SOLN
INTRAMUSCULAR | Status: AC
  Filled 2020-12-26: qty 1

## 2020-12-26 MED ORDER — METHOCARBAMOL 500 MG PO TABS: 500.0000 mg | ORAL_TABLET | Freq: Three times a day (TID) | ORAL | 0 refills | Status: DC

## 2020-12-26 MED ORDER — DEXAMETHASONE SODIUM PHOSPHATE 10 MG/ML IJ SOLN
10.0000 mg | Freq: Once | INTRAMUSCULAR | Status: AC
  Administered 2020-12-26: 10 mg via INTRAMUSCULAR

## 2020-12-26 NOTE — Discharge Instructions (Addendum)
You have been treated for a muscle strain in your lower back  It will typically take a couple of weeks for complete resolution of your symptoms but you should get better and better each day  Take medications as prescribed  Apply to heat to affected areas at least three times a day Avoid heavy lifting and/or strenuous activity for the next several days You can also do warm epsom salt baths  Since you like more natural medicine, look into TENS Unit as well. TENS?stands for (Transcutaneous Electrical Nerve Stimulation).TENS machines?work by sending stimulating pulses across the surface of the skin and along the nerve strands. The stimulating pulses help prevent pain signals from reaching the brain. TENS devices also help stimulate your body to produce higher levels of its own natural painkillers.   Follow-up if:   Your back pain does not improve after 6 weeks of treatment. Your symptoms get worse. Your back pain is severe. You are unable to stand or walk. You develop pain in your legs. You develop weakness in your buttocks or legs. You have difficulty controlling when you urinate or when you have a bowel movement. You have frequent, painful, or bloody urination. You have a temperature over 101.13F (38.3C)

## 2020-12-26 NOTE — ED Triage Notes (Signed)
Pt c/o lt lower back pain after picking up a box at work around 09:30am this morning. Denies pain radiating, states pain is constant. Denies taking any meds for pain.

## 2020-12-26 NOTE — ED Provider Notes (Signed)
[facility]    CSN: [id number] Arrival date & time: 12/26/20  0941      History   Chief Complaint Chief Complaint  Patient presents with   Back Pain    HPI Yolanda Norris is a 50 y.o. female.   Subjective:  Yolanda Norris is a 50 y.o. female who presents for evaluation of low back pain. The patient has had no prior back problems. Patient reports that she had a sudden onset of left lower back pain while attempting to pick up a box while at work. The pain is located in the left lumbar area and radiates across the entire lower back. The pain is described as aching and occurs constantly. She rates her pain as a 8 on a scale of 0-10. Symptoms are exacerbated by extension, flexion, lying down, sitting, and walking. Symptoms are improved by nothing. She has not tried anything for her pain. She informed her supervisor, left work and came directly to the urgent care for treatment/evaluation. She has no leg weakness, dysuria, hematuria, urinary incontinence, bowel incontinence, or groin/perineal numbness associated with the back pain. The patient has no "red flag" history indicative of complicated back pain.  The following portions of the patient's history were reviewed and updated as appropriate: allergies, current medications, past family history, past medical history, past social history, past surgical history, and problem list.     Past Medical History:  Diagnosis Date   Anxiety    Asthma     There are no problems to display for this patient.   Past Surgical History:  Procedure Laterality Date   ANKLE SURGERY     BELPHAROPTOSIS REPAIR     SKIN GRAFT      OB History   No obstetric history on file.      Home Medications    Prior to Admission medications   Medication Sig Start Date End Date Taking? Authorizing Provider  methocarbamol (ROBAXIN) 500 MG tablet Take 1 tablet (500 mg total) by mouth 3 (three) times daily. Take 1 tablet by mouth three  times a day for the next 3 days then may decrease to three times a day as needed for low back muscle pain 12/26/20  Yes Lurline Idol, FNP  naproxen (NAPROSYN) 500 MG tablet Take 1 tablet (500 mg total) by mouth 2 (two) times daily. Take 1 tablet by mouth two times a day for the next 3 days then may decrease to twice daily as needed for pain 12/26/20  Yes Lurline Idol, FNP  cetirizine (ZYRTEC ALLERGY) 10 MG tablet Take 1 tablet (10 mg total) by mouth daily. 05/22/17 08/19/18  Wallis Bamberg, PA-C  fluticasone (FLONASE) 50 MCG/ACT nasal spray Place 2 sprays into both nostrils daily. Patient not taking: Reported on 11/21/2018 08/22/17 11/21/18  Belinda Fisher, PA-C  ipratropium (ATROVENT) 0.06 % nasal spray Place 2 sprays into both nostrils 4 (four) times daily. Patient not taking: Reported on 11/21/2018 08/22/17 11/21/18  Belinda Fisher, PA-C  loratadine (CLARITIN) 10 MG tablet Take 1 tablet (10 mg total) by mouth daily. Patient not taking: Reported on 11/21/2018 05/22/17 12/09/18  Wallis Bamberg, PA-C    Family History Family History  Problem Relation Age of Onset   Diabetes Father     Social History Social History   Tobacco Use   Smoking status: Some Days    Packs/day: 0.50    Types: Cigarettes   Smokeless tobacco: Never  Vaping Use   Vaping Use: Never used  Substance  Use Topics   Alcohol use: No   Drug use: No     Allergies   Patient has no known allergies.   Review of Systems Review of Systems  Constitutional:  Negative for fever.  Genitourinary:  Negative for dysuria.  Musculoskeletal:  Positive for back pain.  Neurological:  Negative for weakness.  All other systems reviewed and are negative.   Physical Exam Triage Vital Signs ED Triage Vitals  Enc Vitals Group     BP 12/26/20 1119 119/85     Pulse Rate 12/26/20 1119 90     Resp 12/26/20 1119 18     Temp 12/26/20 1119 98.3 F (36.8 C)     Temp Source 12/26/20 1119 Oral     SpO2 12/26/20 1119 99 %     Weight --       Height --      Head Circumference --      Peak Flow --      Pain Score 12/26/20 1120 8     Pain Loc --      Pain Edu? --      Excl. in [place]? --    No data found.  Updated Vital Signs BP 119/85 (BP Location: Left Arm)   Pulse 90   Temp 98.3 F (36.8 C) (Oral)   Resp 18   SpO2 99%   Visual Acuity Right Eye Distance:   Left Eye Distance:   Bilateral Distance:    Right Eye Near:   Left Eye Near:    Bilateral Near:     Physical Exam Vitals and nursing note reviewed.  Constitutional:      General: She is awake.     Appearance: Normal appearance. She is well-developed. She is not ill-appearing or toxic-appearing.     Comments: Uncomfortable   HENT:     Head: Normocephalic.  Cardiovascular:     Rate and Rhythm: Normal rate.  Pulmonary:     Effort: Pulmonary effort is normal.  Abdominal:     Palpations: Abdomen is soft.     Tenderness: There is no right CVA tenderness or left CVA tenderness.  Musculoskeletal:     Cervical back: Normal, normal range of motion and neck supple.     Thoracic back: Normal.     Lumbar back: Tenderness present. No bony tenderness. Normal range of motion.  Skin:    General: Skin is warm and dry.  Neurological:     General: No focal deficit present.     Mental Status: She is alert and oriented to person, place, and time.  Psychiatric:        Behavior: Behavior is cooperative.     UC Treatments / Results  Labs (all labs ordered are listed, but only abnormal results are displayed) Labs Reviewed - No data to display  EKG   Radiology No results found.  Procedures Procedures (including critical care time)  Medications Ordered in UC Medications  ketorolac (TORADOL) injection 60 mg (60 mg Intramuscular Given 12/26/20 1312)  dexamethasone (DECADRON) injection 10 mg (10 mg Intramuscular Given 12/26/20 1311)    Initial Impression / Assessment and Plan / UC Course  I have reviewed the triage vital signs and the nursing  notes.  Pertinent labs & imaging results that were available during my care of the patient were reviewed by me and considered in my medical decision making (see chart for details).     50 yo female presenting with an acute lumbar strain after a lifting injury at  work. She has no leg weakness, dysuria, hematuria, urinary incontinence, bowel incontinence, or groin/perineal numbness associated with the back pain. The patient has no "red flag" history indicative of complicated back pain. Toradol and decadron injection given in the clinic. She has been prescribed naproxen and robaxin to take around the clock for the next 72 hours then she may reduce to PRN. Activity limitations and supportive measures discussed as well indications for follow-up. Patient may return to work on Monday 11/21/   Today's evaluation has revealed no signs of a dangerous process. Discussed diagnosis with patient and/or guardian. Patient and/or guardian aware of their diagnosis, possible red flag symptoms to watch out for and need for close follow up. Patient and/or guardian understands verbal and written discharge instructions. Patient and/or guardian comfortable with plan and disposition.  Patient and/or guardian has a clear mental status at this time, good insight into illness (after discussion and teaching) and has clear judgment to make decisions regarding their care  This care was provided during an unprecedented National Emergency due to the Novel Coronavirus (COVID-19) pandemic. COVID-19 infections and transmission risks place heavy strains on healthcare resources.  As this pandemic evolves, our facility, providers, and staff strive to respond fluidly, to remain operational, and to provide care relative to available resources and information. Outcomes are unpredictable and treatments are without well-defined guidelines. Further, the impact of COVID-19 on all aspects of urgent care, including the impact to patients seeking care  for reasons other than COVID-19, is unavoidable during this national emergency. At this time of the global pandemic, management of patients has significantly changed, even for non-COVID positive patients given high local and regional COVID volumes at this time requiring high healthcare system and resource utilization. The standard of care for management of both COVID suspected and non-COVID suspected patients continues to change rapidly at the local, regional, national, and global levels. This patient was worked up and treated to the best available but ever changing evidence and resources available at this current time.   Documentation was completed with the aid of voice recognition software. Transcription may contain typographical errors. Final Clinical Impressions(s) / UC Diagnoses   Final diagnoses:  Strain of lumbar region, initial encounter     Discharge Instructions      You have been treated for a muscle strain in your lower back  It will typically take a couple of weeks for complete resolution of your symptoms but you should get better and better each day  Take medications as prescribed  Apply to heat to affected areas at least three times a day Avoid heavy lifting and/or strenuous activity for the next several days You can also do warm epsom salt baths  Since you like more natural medicine, look into TENS Unit as well. TENS?stands for (Transcutaneous Electrical Nerve Stimulation).TENS machines?work by sending stimulating pulses across the surface of the skin and along the nerve strands. The stimulating pulses help prevent pain signals from reaching the brain. TENS devices also help stimulate your body to produce higher levels of its own natural painkillers.   Follow-up if:   Your back pain does not improve after 6 weeks of treatment. Your symptoms get worse. Your back pain is severe. You are unable to stand or walk. You develop pain in your legs. You develop weakness in your  buttocks or legs. You have difficulty controlling when you urinate or when you have a bowel movement. You have frequent, painful, or bloody urination. You have a temperature over 101.5F (  38.3C)       ED Prescriptions     Medication Sig Dispense Auth. Provider   methocarbamol (ROBAXIN) 500 MG tablet Take 1 tablet (500 mg total) by mouth 3 (three) times daily. Take 1 tablet by mouth three times a day for the next 3 days then may decrease to three times a day as needed for low back muscle pain 20 tablet Enrique Sack, FNP   naproxen (NAPROSYN) 500 MG tablet Take 1 tablet (500 mg total) by mouth 2 (two) times daily. Take 1 tablet by mouth two times a day for the next 3 days then may decrease to twice daily as needed for pain 30 tablet Enrique Sack, FNP      PDMP not reviewed this encounter.   Enrique Sack,  12/26/20 1329

## 2023-03-10 ENCOUNTER — Ambulatory Visit (HOSPITAL_COMMUNITY)
Admission: EM | Admit: 2023-03-10 | Discharge: 2023-03-10 | Disposition: A | Discharge status: Home / Self Care | Payer: 59 | Attending: Family Medicine | Admitting: Family Medicine

## 2023-03-10 ENCOUNTER — Encounter (HOSPITAL_COMMUNITY): Payer: Self-pay

## 2023-03-10 DIAGNOSIS — R350 Frequency of micturition: Secondary | ICD-10-CM | POA: Diagnosis not present

## 2023-03-10 DIAGNOSIS — N764 Abscess of vulva: Secondary | ICD-10-CM

## 2023-03-10 LAB — POCT URINALYSIS DIP (MANUAL ENTRY)
Bilirubin, UA: NEGATIVE
Blood, UA: NEGATIVE
Glucose, UA: NEGATIVE mg/dL
Ketones, POC UA: NEGATIVE mg/dL
Leukocytes, UA: NEGATIVE
Nitrite, UA: NEGATIVE
Spec Grav, UA: 1.025 (ref 1.010–1.025)
Urobilinogen, UA: 1 U/dL
pH, UA: 6 (ref 5.0–8.0)

## 2023-03-10 MED ORDER — SULFAMETHOXAZOLE-TRIMETHOPRIM 800-160 MG PO TABS: 1.0000 | ORAL_TABLET | Freq: Two times a day (BID) | ORAL | 0 refills | Status: AC

## 2023-03-10 NOTE — ED Provider Notes (Signed)
[facility]    CSN: [id number] Arrival date & time: 03/10/23  0865      History   Chief Complaint Chief Complaint  Patient presents with   Urinary Tract Infection    HPI Yolanda Norris is a 53 y.o. female.    Urinary Tract Infection  Patient is here for urinary symptoms.  She did have a strong odor to her urine.  She used otc azo yesterday, so the odor is improved.  She is having urinary frequency, no painful urination.   Noted a bump to the crack of her behind, another a bit further down.  She has swelling to the left vaginal lip.  No vaginal discharge,but she did have some itching to the groins.        Past Medical History:  Diagnosis Date   Anxiety    Asthma     There are no active problems to display for this patient.   Past Surgical History:  Procedure Laterality Date   ANKLE SURGERY     BELPHAROPTOSIS REPAIR     SKIN GRAFT      OB History   No obstetric history on file.      Home Medications    Prior to Admission medications   Medication Sig Start Date End Date Taking? Authorizing Provider  cetirizine (ZYRTEC ALLERGY) 10 MG tablet Take 1 tablet (10 mg total) by mouth daily. 05/22/17 08/19/18  Wallis Bamberg, PA-C  fluticasone (FLONASE) 50 MCG/ACT nasal spray Place 2 sprays into both nostrils daily. Patient not taking: Reported on 11/21/2018 08/22/17 11/21/18  Belinda Fisher, PA-C  ipratropium (ATROVENT) 0.06 % nasal spray Place 2 sprays into both nostrils 4 (four) times daily. Patient not taking: Reported on 11/21/2018 08/22/17 11/21/18  Belinda Fisher, PA-C  loratadine (CLARITIN) 10 MG tablet Take 1 tablet (10 mg total) by mouth daily. Patient not taking: Reported on 11/21/2018 05/22/17 12/09/18  Wallis Bamberg, PA-C    Family History Family History  Problem Relation Age of Onset   Diabetes Father     Social History Social History   Tobacco Use   Smoking status: Some Days    Current packs/day: 0.50    Types: Cigarettes   Smokeless  tobacco: Never  Vaping Use   Vaping status: Never Used  Substance Use Topics   Alcohol use: No   Drug use: No     Allergies   Patient has no known allergies.   Review of Systems Review of Systems  Constitutional: Negative.   HENT: Negative.    Respiratory: Negative.    Cardiovascular: Negative.   Gastrointestinal: Negative.   Genitourinary:  Positive for frequency.  Skin:  Positive for wound.     Physical Exam Triage Vital Signs ED Triage Vitals [03/10/23 1016]  Encounter Vitals Group     BP 117/75     Systolic BP Percentile      Diastolic BP Percentile      Pulse Rate 79     Resp 18     Temp 98.5 F (36.9 C)     Temp Source Oral     SpO2 96 %     Weight      Height      Head Circumference      Peak Flow      Pain Score 7     Pain Loc      Pain Education      Exclude from Growth Chart    No data found.  Updated  Vital Signs BP 117/75 (BP Location: Left Arm)   Pulse 79   Temp 98.5 F (36.9 C) (Oral)   Resp 18   SpO2 96%   Visual Acuity Right Eye Distance:   Left Eye Distance:   Bilateral Distance:    Right Eye Near:   Left Eye Near:    Bilateral Near:     Physical Exam Constitutional:      Appearance: Normal appearance. She is normal weight.  Cardiovascular:     Rate and Rhythm: Normal rate and regular rhythm.  Pulmonary:     Effort: Pulmonary effort is normal.     Breath sounds: Normal breath sounds.  Abdominal:     Palpations: Abdomen is soft.  Skin:    Comments: No abscess noted to the right buttock/gluteal cleft;  she has multiple scars from previous abscesses.  There are multiple firm cysts to the labial area;  she has tenderness and fullness to the left labia;  there is an area that is draining serosanginous fluid  Neurological:     General: No focal deficit present.     Mental Status: She is alert.  Psychiatric:        Mood and Affect: Mood normal.      UC Treatments / Results  Labs (all labs ordered are listed, but only  abnormal results are displayed) Labs Reviewed  POCT URINALYSIS DIP (MANUAL ENTRY) - Abnormal; Notable for the following components:      Result Value   Color, UA straw (*)    Protein Ur, POC trace (*)    All other components within normal limits    EKG   Radiology No results found.  Procedures Procedures (including critical care time)  Medications Ordered in UC Medications - No data to display  Initial Impression / Assessment and Plan / UC Course  I have reviewed the triage vital signs and the nursing notes.  Pertinent labs & imaging results that were available during my care of the patient were reviewed by me and considered in my medical decision making (see chart for details).    Final Clinical Impressions(s) / UC Diagnoses   Final diagnoses:  Abscess of labia  Urinary frequency     Discharge Instructions      You were seen today for various issues.  I have sent out an antibiotic for the abscess to the labia.  I recommend warm soaks as well.  Your urine appears normal today.  The odor may be from the cyst that is draining.  Please return if you are not improving or worsening despite medication.     ED Prescriptions     Medication Sig Dispense Auth. Provider   sulfamethoxazole-trimethoprim (BACTRIM DS) 800-160 MG tablet Take 1 tablet by mouth 2 (two) times daily for 7 days. 14 tablet Jannifer Franklin, MD      PDMP not reviewed this encounter.   Jannifer Franklin, MD 03/10/23 1050

## 2023-03-10 NOTE — ED Triage Notes (Signed)
Pt c/o a strong odor on urination. Denies any other urinary sx's. Pt c/o vaginal itching and bumps to inner buttocks. Sx's going on for a wk. States took AZO with relief.

## 2023-03-10 NOTE — Discharge Instructions (Addendum)
You were seen today for various issues.  I have sent out an antibiotic for the abscess to the labia.  I recommend warm soaks as well.  Your urine appears normal today.  The odor may be from the cyst that is draining.  Please return if you are not improving or worsening despite medication.

## 2023-03-23 ENCOUNTER — Encounter (HOSPITAL_COMMUNITY): Payer: Self-pay | Admitting: *Deleted

## 2023-03-23 ENCOUNTER — Ambulatory Visit (HOSPITAL_COMMUNITY)
Admission: EM | Admit: 2023-03-23 | Discharge: 2023-03-23 | Disposition: A | Discharge status: Home / Self Care | Payer: 59 | Attending: Emergency Medicine | Admitting: Emergency Medicine

## 2023-03-23 DIAGNOSIS — J069 Acute upper respiratory infection, unspecified: Secondary | ICD-10-CM

## 2023-03-23 DIAGNOSIS — R350 Frequency of micturition: Secondary | ICD-10-CM | POA: Insufficient documentation

## 2023-03-23 DIAGNOSIS — R3915 Urgency of urination: Secondary | ICD-10-CM | POA: Insufficient documentation

## 2023-03-23 DIAGNOSIS — B9789 Other viral agents as the cause of diseases classified elsewhere: Secondary | ICD-10-CM | POA: Insufficient documentation

## 2023-03-23 DIAGNOSIS — J4521 Mild intermittent asthma with (acute) exacerbation: Secondary | ICD-10-CM

## 2023-03-23 LAB — POCT URINALYSIS DIP (MANUAL ENTRY)
Bilirubin, UA: NEGATIVE
Glucose, UA: NEGATIVE mg/dL
Ketones, POC UA: NEGATIVE mg/dL
Leukocytes, UA: NEGATIVE
Nitrite, UA: NEGATIVE
Protein Ur, POC: NEGATIVE mg/dL
Spec Grav, UA: 1.02 (ref 1.010–1.025)
Urobilinogen, UA: 0.2 U/dL
pH, UA: 6 (ref 5.0–8.0)

## 2023-03-23 LAB — POC COVID19/FLU A&B COMBO
Covid Antigen, POC: NEGATIVE
Influenza A Antigen, POC: NEGATIVE
Influenza B Antigen, POC: NEGATIVE

## 2023-03-23 MED ORDER — BENZONATATE 200 MG PO CAPS: 200.0000 mg | ORAL_CAPSULE | Freq: Three times a day (TID) | ORAL | 0 refills | Status: DC

## 2023-03-23 MED ORDER — ALBUTEROL SULFATE (2.5 MG/3ML) 0.083% IN NEBU
2.5000 mg | INHALATION_SOLUTION | Freq: Four times a day (QID) | RESPIRATORY_TRACT | 12 refills | Status: AC | PRN
Start: 2023-03-23 — End: ?

## 2023-03-23 MED ORDER — IPRATROPIUM-ALBUTEROL 0.5-2.5 (3) MG/3ML IN SOLN
RESPIRATORY_TRACT | Status: AC
  Filled 2023-03-23: qty 3

## 2023-03-23 MED ORDER — IPRATROPIUM-ALBUTEROL 0.5-2.5 (3) MG/3ML IN SOLN
3.0000 mL | Freq: Once | RESPIRATORY_TRACT | Status: AC
  Administered 2023-03-23: 3 mL via RESPIRATORY_TRACT

## 2023-03-23 MED ORDER — PREDNISONE 20 MG PO TABS
40.0000 mg | ORAL_TABLET | Freq: Every day | ORAL | 0 refills | Status: AC
Start: 2023-03-23 — End: 2023-03-28

## 2023-03-23 NOTE — ED Notes (Signed)
Provider to fax to pharmacy Neb solution

## 2023-03-23 NOTE — ED Triage Notes (Signed)
Pt states she is SOB, cough, wheezing X 3 days which is making her asthma worse. She states she is using her albuterol MDI but no relief. She used it last in the lobby since at clinic.    She also complains of urine frequency since yesterday.

## 2023-03-23 NOTE — ED Provider Notes (Signed)
[facility]    CSN: [id number] Arrival date & time: 03/23/23  4098      History   Chief Complaint Chief Complaint  Patient presents with   Shortness of Breath   Cough   Wheezing   Urinary Frequency    HPI Yolanda Norris is a 53 y.o. female.   Patient presents to clinic for complaints of wheezing, shortness of breath and a dry cough for the past 2 days that has been exacerbating her asthma.  Reports she typically does not have to use her inhaler, last use a few months ago but she has been using it about every hour over the past 2 days.  She has been using over-the-counter nasal spray without much improvement in her nasal congestion.  Woke up with a headache this morning as well as generalized bodyaches.  She has been having some urinary urgency and frequency that started yesterday.  No dysuria.  Does not think she has had fevers.  Multiple sick contacts at work recently.  The history is provided by the patient and medical records.  Shortness of Breath Associated symptoms: wheezing   Cough Associated symptoms: wheezing   Wheezing Urinary Frequency    Past Medical History:  Diagnosis Date   Anxiety    Asthma     There are no active problems to display for this patient.   Past Surgical History:  Procedure Laterality Date   ANKLE SURGERY     BELPHAROPTOSIS REPAIR     SKIN GRAFT      OB History   No obstetric history on file.      Home Medications    Prior to Admission medications   Medication Sig Start Date End Date Taking? Authorizing Provider  albuterol (VENTOLIN HFA) 108 (90 Base) MCG/ACT inhaler Inhale into the lungs. 05/22/17  Yes [provider]  benzonatate (TESSALON) 200 MG capsule Take 1 capsule (200 mg total) by mouth every 8 (eight) hours. 03/23/23  Yes Rinaldo Ratel, [place] N, FNP  predniSONE (DELTASONE) 20 MG tablet Take 2 tablets (40 mg total) by mouth daily for 5 days. 03/23/23 03/28/23 Yes Rinaldo Ratel, [place], FNP   cetirizine (ZYRTEC ALLERGY) 10 MG tablet Take 1 tablet (10 mg total) by mouth daily. 05/22/17 08/19/18  Wallis Bamberg, PA-C  fluticasone (FLONASE) 50 MCG/ACT nasal spray Place 2 sprays into both nostrils daily. Patient not taking: Reported on 11/21/2018 08/22/17 11/21/18  Belinda Fisher, PA-C  ipratropium (ATROVENT) 0.06 % nasal spray Place 2 sprays into both nostrils 4 (four) times daily. Patient not taking: Reported on 11/21/2018 08/22/17 11/21/18  Belinda Fisher, PA-C  loratadine (CLARITIN) 10 MG tablet Take 1 tablet (10 mg total) by mouth daily. Patient not taking: Reported on 11/21/2018 05/22/17 12/09/18  Wallis Bamberg, PA-C    Family History Family History  Problem Relation Age of Onset   Diabetes Father     Social History Social History   Tobacco Use   Smoking status: Some Days    Current packs/day: 0.50    Types: Cigarettes   Smokeless tobacco: Never  Vaping Use   Vaping status: Never Used  Substance Use Topics   Alcohol use: No   Drug use: No     Allergies   Patient has no known allergies.   Review of Systems Review of Systems  Respiratory:  Positive for wheezing.   Genitourinary:  Positive for frequency.    Per HPI   Physical Exam Triage Vital Signs ED Triage Vitals  Encounter Vitals  Group     BP 03/23/23 0855 (!) 142/87     Systolic BP Percentile --      Diastolic BP Percentile --      Pulse Rate 03/23/23 0855 (!) 104     Resp 03/23/23 0855 (!) 22     Temp 03/23/23 0855 98.6 F (37 C)     Temp Source 03/23/23 0855 Oral     SpO2 03/23/23 0855 96 %     Weight --      Height --      Head Circumference --      Peak Flow --      Pain Score 03/23/23 0854 0     Pain Loc --      Pain Education --      Exclude from Growth Chart --    No data found.  Updated Vital Signs BP (!) 142/87 (BP Location: Left Arm)   Pulse (!) 104   Temp 98.6 F (37 C) (Oral)   Resp (!) 22   SpO2 96%   Visual Acuity Right Eye Distance:   Left Eye Distance:   Bilateral Distance:     Right Eye Near:   Left Eye Near:    Bilateral Near:     Physical Exam Vitals and nursing note reviewed.  Constitutional:      Appearance: Normal appearance. She is well-developed.  HENT:     Head: Normocephalic and atraumatic.     Right Ear: External ear normal.     Left Ear: External ear normal.     Nose: Congestion and rhinorrhea present.     Mouth/Throat:     Mouth: Mucous membranes are moist.  Eyes:     Conjunctiva/sclera: Conjunctivae normal.  Cardiovascular:     Rate and Rhythm: Normal rate and regular rhythm.     Heart sounds: Normal heart sounds. No murmur heard. Pulmonary:     Effort: Pulmonary effort is normal.     Breath sounds: Wheezing present.  Musculoskeletal:        General: Normal range of motion.  Skin:    General: Skin is warm and dry.  Neurological:     General: No focal deficit present.     Mental Status: She is alert and oriented to person, place, and time.  Psychiatric:        Mood and Affect: Mood normal.        Behavior: Behavior normal.      UC Treatments / Results  Labs (all labs ordered are listed, but only abnormal results are displayed) Labs Reviewed  POCT URINALYSIS DIP (MANUAL ENTRY) - Abnormal; Notable for the following components:      Result Value   Blood, UA trace-intact (*)    All other components within normal limits  URINE CULTURE  POC COVID19/FLU A&B COMBO    EKG   Radiology No results found.  Procedures Procedures (including critical care time)  Medications Ordered in UC Medications  ipratropium-albuterol (DUONEB) 0.5-2.5 (3) MG/3ML nebulizer solution 3 mL (3 mLs Nebulization Given 03/23/23 0940)    Initial Impression / Assessment and Plan / UC Course  I have reviewed the triage vital signs and the nursing notes.  Pertinent labs & imaging results that were available during my care of the patient were reviewed by me and considered in my medical decision making (see chart for details).  Vitals and triage  reviewed, patient is hemodynamically stable.  Lungs with good air movement and diffuse expiratory wheezing.  DuoNeb given in  clinic for asthma exacerbation.  Wheezing improved as well as shortness of breath after DuoNeb.  Will treat with steroid burst for asthma exacerbation.  Recent sick contacts and symptoms of viral URI.  POC COVID and flu negative, suspect other viral URI.  Symptomatic management discussed.  With urgency and frequency, UA shows red blood cells, will send for culture.  Patient did recently complete antibiotic therapy with Bactrim twice daily for 7 days started on 03/10/23.   Plan of care, follow-up care return precautions given, no questions at this time.  Work note provided.     Final Clinical Impressions(s) / UC Diagnoses   Final diagnoses:  Viral URI with cough  Mild intermittent asthma with exacerbation     Discharge Instructions      Your COVID and flu testing were negative, you most likely have a different viral illness.  Please rest, drink plenty of water and alternate between Tylenol and ibuprofen every 4-6 hours for any body aches, fever or chills.  Start the steroids today and take them daily with breakfast for the next 5 days.  You can use the Tessalon every 8 hours for cough.  Sleep with a humidifier may help as well.  Symptoms should improve over the next 5 to 7 days, if no improvement or any changes return to clinic or follow-up with your primary care provider for reevaluation.      ED Prescriptions     Medication Sig Dispense Auth. Provider   predniSONE (DELTASONE) 20 MG tablet Take 2 tablets (40 mg total) by mouth daily for 5 days. 10 tablet Rinaldo Ratel, [place], FNP   benzonatate (TESSALON) 200 MG capsule Take 1 capsule (200 mg total) by mouth every 8 (eight) hours. 30 capsule Mersedes Alber, [place] N, [place]      PDMP not reviewed this encounter.   Rinaldo Ratel [place] N, [place] 03/23/23 [phone]

## 2023-03-23 NOTE — Discharge Instructions (Addendum)
Your COVID and flu testing were negative, you most likely have a different viral illness.  Please rest, drink plenty of water and alternate between Tylenol and ibuprofen every 4-6 hours for any body aches, fever or chills.  Start the steroids today and take them daily with breakfast for the next 5 days.  You can use the Tessalon every 8 hours for cough.  Sleep with a humidifier may help as well.  Symptoms should improve over the next 5 to 7 days, if no improvement or any changes return to clinic or follow-up with your primary care provider for reevaluation.

## 2023-03-24 LAB — URINE CULTURE: Culture: <10000 — AB

## 2023-04-04 ENCOUNTER — Ambulatory Visit (HOSPITAL_COMMUNITY)
Admission: EM | Admit: 2023-04-04 | Discharge: 2023-04-04 | Disposition: A | Discharge status: Home / Self Care | Payer: 59 | Attending: Family Medicine | Admitting: Family Medicine

## 2023-04-04 ENCOUNTER — Encounter (HOSPITAL_COMMUNITY): Payer: Self-pay | Admitting: Emergency Medicine

## 2023-04-04 ENCOUNTER — Ambulatory Visit (INDEPENDENT_AMBULATORY_CARE_PROVIDER_SITE_OTHER): Payer: 59

## 2023-04-04 DIAGNOSIS — J4541 Moderate persistent asthma with (acute) exacerbation: Secondary | ICD-10-CM

## 2023-04-04 DIAGNOSIS — R0602 Shortness of breath: Secondary | ICD-10-CM

## 2023-04-04 MED ORDER — ALBUTEROL SULFATE HFA 108 (90 BASE) MCG/ACT IN AERS: 1.0000 | INHALATION_SPRAY | Freq: Four times a day (QID) | RESPIRATORY_TRACT | 1 refills | Status: AC | PRN

## 2023-04-04 MED ORDER — PREDNISONE 10 MG (48) PO TBPK: ORAL_TABLET | ORAL | 0 refills | Status: DC

## 2023-04-04 MED ORDER — IPRATROPIUM-ALBUTEROL 0.5-2.5 (3) MG/3ML IN SOLN: 3.0000 mL | Freq: Once | RESPIRATORY_TRACT | Status: DC

## 2023-04-04 MED ORDER — IPRATROPIUM-ALBUTEROL 0.5-2.5 (3) MG/3ML IN SOLN
RESPIRATORY_TRACT | Status: AC
  Filled 2023-04-04: qty 3

## 2023-04-04 NOTE — ED Triage Notes (Addendum)
 Pt reports was seen here about 2 weeks for RSV and asthma exacerbation. Pt reports that she got doctor note that didn't have restrictions. Pt reports that she gets SOB with exertion and works at [employer].  Pt reports still coughing and wheezing.  Taking Rx meds that was prescribed as well as Mucinex.

## 2023-04-07 NOTE — ED Provider Notes (Signed)
 [facility]   [id number] 04/04/23 Arrival Time: 0930  ASSESSMENT & PLAN:  1. SOB (shortness of breath)   2. Moderate persistent asthma with acute exacerbation    I have personally viewed and independently interpreted the imaging studies ordered this visit. CXR: no acute changes.  Much improvement after duoneb. Without resp distress.  Meds ordered this encounter  Medications   ipratropium-albuterol (DUONEB) 0.5-2.5 (3) MG/3ML nebulizer solution 3 mL   albuterol (VENTOLIN HFA) 108 (90 Base) MCG/ACT inhaler    Sig: Inhale 1-2 puffs into the lungs every 6 (six) hours as needed for wheezing or shortness of breath.    Dispense:  1 each    Refill:  1   predniSONE (STERAPRED UNI-PAK 48 TAB) 10 MG (48) TBPK tablet    Sig: Take as directed.    Dispense:  48 tablet    Refill:  0    Asthma precautions given. OTC symptom care as needed.  Recommend:  Follow-up Information     [facility].   Specialty: Emergency Medicine Why: If symptoms worsen in any way. Contact information: [address] [place] [address] [phone]                Reviewed expectations re: course of current medical issues. Questions answered. Outlined signs and symptoms indicating need for more acute intervention. Patient verbalized understanding. After Visit Summary given.  SUBJECTIVE: History from: patient.  Yolanda Norris is a 53 y.o. female who reports being seen here about 2 weeks for RSV and asthma exacerbation. Pt reports that she got doctor note that didn't have restrictions. Pt reports that she gets SOB with exertion and works at [employer] office.  Pt reports still coughing and wheezing.  Taking Rx meds that was prescribed as well as Mucinex.  Ambulatory without difficulty. No LE edema.  Social History   Tobacco Use  Smoking Status Some Days   Current packs/day: 0.50   Types: Cigarettes  Smokeless Tobacco  Never     OBJECTIVE:  Vitals:   04/04/23 1017  BP: 123/67  Pulse: 96  Temp: 98.3 F (36.8 C)  TempSrc: Oral  SpO2: 95%     General appearance: alert; NAD HEENT: [place]; AT; with mild nasal congestion Neck: supple without LAD Cv: RRR without murmer Lungs: unlabored respirations, moderate bilateral expiratory wheezing; cough: mild; no significant respiratory distress Skin: warm and dry Psychological: alert and cooperative; normal mood and affect  Imaging: DG Chest 2 View Result Date: 04/04/2023 CLINICAL DATA:  Shortness of breath. Recent RSV and asthma exacerbation. EXAM: CHEST - 2 VIEW COMPARISON:  Radiographs 11/11/2019 and 12/09/2018. FINDINGS: The heart size and mediastinal contours are stable. There is central airway thickening without airspace disease, pleural effusion or pneumothorax. The bones appear unchanged. IMPRESSION: Central airway thickening consistent with bronchitis or asthma. No evidence of pneumonia. Electronically Signed   By: Carey Bullocks M.D.   On: 04/04/2023 12:43    No Known Allergies  Past Medical History:  Diagnosis Date   Anxiety    Asthma    Family History  Problem Relation Age of Onset   Diabetes Father    Social History   Socioeconomic History   Marital status: Single    Spouse name: Not on file   Number of children: Not on file   Years of education: Not on file   Highest education level: Not on file  Occupational History   Not on file  Tobacco Use  Smoking status: Some Days    Current packs/day: 0.50    Types: Cigarettes   Smokeless tobacco: Never  Vaping Use   Vaping status: Never Used  Substance and Sexual Activity   Alcohol use: No   Drug use: No   Sexual activity: Not Currently    Birth control/protection: I.U.D.  Other Topics Concern   Not on file  Social History Narrative   Not on file   Social Drivers of Health   Financial Resource Strain: Not on file  Food Insecurity: Not on file  Transportation Needs: Not on  file  Physical Activity: Not on file  Stress: Not on file  Social Connections: Not on file  Intimate Partner Violence: Not on file             Mardella Layman, MD 04/07/23 1321

## 2023-08-04 ENCOUNTER — Encounter (HOSPITAL_COMMUNITY): Payer: Self-pay

## 2023-08-04 ENCOUNTER — Ambulatory Visit (HOSPITAL_COMMUNITY)
Admission: EM | Admit: 2023-08-04 | Discharge: 2023-08-04 | Disposition: A | Discharge status: Home / Self Care | Attending: Emergency Medicine | Admitting: Emergency Medicine

## 2023-08-04 ENCOUNTER — Ambulatory Visit (INDEPENDENT_AMBULATORY_CARE_PROVIDER_SITE_OTHER)

## 2023-08-04 DIAGNOSIS — R051 Acute cough: Secondary | ICD-10-CM | POA: Diagnosis not present

## 2023-08-04 DIAGNOSIS — J4541 Moderate persistent asthma with (acute) exacerbation: Secondary | ICD-10-CM

## 2023-08-04 DIAGNOSIS — J988 Other specified respiratory disorders: Secondary | ICD-10-CM | POA: Diagnosis not present

## 2023-08-04 DIAGNOSIS — B9789 Other viral agents as the cause of diseases classified elsewhere: Secondary | ICD-10-CM

## 2023-08-04 MED ORDER — IPRATROPIUM-ALBUTEROL 0.5-2.5 (3) MG/3ML IN SOLN
3.0000 mL | Freq: Once | RESPIRATORY_TRACT | Status: AC
  Administered 2023-08-04: 3 mL via RESPIRATORY_TRACT

## 2023-08-04 MED ORDER — METHYLPREDNISOLONE SODIUM SUCC 125 MG IJ SOLR
INTRAMUSCULAR | Status: AC
  Filled 2023-08-04: qty 2

## 2023-08-04 MED ORDER — BENZONATATE 100 MG PO CAPS
100.0000 mg | ORAL_CAPSULE | Freq: Three times a day (TID) | ORAL | 0 refills | Status: AC
Start: 2023-08-04 — End: ?

## 2023-08-04 MED ORDER — METHYLPREDNISOLONE SODIUM SUCC 125 MG IJ SOLR
125.0000 mg | Freq: Once | INTRAMUSCULAR | Status: AC
  Administered 2023-08-04: 125 mg via INTRAMUSCULAR

## 2023-08-04 MED ORDER — IPRATROPIUM-ALBUTEROL 0.5-2.5 (3) MG/3ML IN SOLN
RESPIRATORY_TRACT | Status: AC
  Filled 2023-08-04: qty 3

## 2023-08-04 MED ORDER — PROMETHAZINE-DM 6.25-15 MG/5ML PO SYRP: 5.0000 mL | ORAL_SOLUTION | Freq: Every evening | ORAL | 0 refills | Status: AC | PRN

## 2023-08-04 MED ORDER — PREDNISONE 20 MG PO TABS: 40.0000 mg | ORAL_TABLET | Freq: Every day | ORAL | 0 refills | Status: AC

## 2023-08-04 NOTE — Discharge Instructions (Addendum)
 Start taking 2 tablets of prednisone  once daily for 5 days to assist with asthma exacerbation. Use albuterol  inhaler or nebulizer treatment every 6 hours as needed for wheezing or shortness of breath.  You do have multiple refills of nebulizer treatments already at your pharmacy that you can pick up today.  You also have an additional refill of your albuterol  inhaler when you need this. Take Tessalon  every 8 hours as needed for cough. Take Promethazine DM cough syrup at bedtime as needed for cough.  This can make you drowsy so do not drive, work, or drink alcohol while taking this. You can continue to use saline nasal spray and take over-the-counter Mucinex  to help with your symptoms. Follow-up with [facility] for further management and evaluation of your asthma. Return here as needed. If you develop worsening trouble breathing, chest pain, dizziness, lightheadedness, passing out, or fevers unrelieved by medication please seek immediate medical treatment in the emergency department.

## 2023-08-04 NOTE — ED Triage Notes (Signed)
 Pt states over the past few days her sinuses have been stopped up. Patient states she has become more short of breath and is having a lot of wheezing. Patient has used saline nasal spray and her inhaler with no relief.

## 2023-08-04 NOTE — ED Provider Notes (Signed)
 [facility]    CSN: [id number] Arrival date & time: 08/04/23  9066      History   Chief Complaint Chief Complaint  Patient presents with   Wheezing    HPI Yolanda Norris is a 53 y.o. female.   Patient presents with cough, nasal congestion, chest congestion, wheezing, and shortness of breath for about a week.  Patient states that she has had some intermittent shortness of breath since being seen here in February, but states that it has become much worse over the last week.    Patient has history of asthma. Patient states that she has been using her albuterol  inhaler frequently without relief of this.  Patient states that she is also been using saline nasal spray with minimal relief.  Patient states that she ran out of nebulizer treatments and needs a refill on these.  Denies fever, body aches, chills, chest pain, nausea, vomiting, diarrhea, and abdominal pain.  Patient denies any known sick contacts.  The history is provided by the patient and medical records.  Wheezing   Past Medical History:  Diagnosis Date   Anxiety    Asthma     There are no active problems to display for this patient.   Past Surgical History:  Procedure Laterality Date   ANKLE SURGERY     BELPHAROPTOSIS REPAIR     SKIN GRAFT      OB History   No obstetric history on file.      Home Medications    Prior to Admission medications   Medication Sig Start Date End Date Taking? Authorizing Provider  benzonatate  (TESSALON ) 100 MG capsule Take 1 capsule (100 mg total) by mouth every 8 (eight) hours. 08/04/23  Yes Johnie, Timmya Blazier A, NP  predniSONE  (DELTASONE ) 20 MG tablet Take 2 tablets (40 mg total) by mouth daily for 5 days. 08/04/23 08/09/23 Yes Meir Elwood A, NP  promethazine-dextromethorphan (PROMETHAZINE-DM) 6.25-15 MG/5ML syrup Take 5 mLs by mouth at bedtime as needed for cough. 08/04/23  Yes Johnie, Bain Whichard A, NP  albuterol  (PROVENTIL ) (2.5 MG/3ML) 0.083% nebulizer  solution Take 3 mLs (2.5 mg total) by nebulization every 6 (six) hours as needed for wheezing or shortness of breath. 03/23/23   Dreama, [place]  N, FNP  albuterol  (VENTOLIN  HFA) 108 (90 Base) MCG/ACT inhaler Inhale 1-2 puffs into the lungs every 6 (six) hours as needed for wheezing or shortness of breath. 04/04/23   Rolinda Rogue, MD  cetirizine  (ZYRTEC  ALLERGY) 10 MG tablet Take 1 tablet (10 mg total) by mouth daily. 05/22/17 08/19/18  Christopher Savannah, PA-C  fluticasone  (FLONASE ) 50 MCG/ACT nasal spray Place 2 sprays into both nostrils daily. Patient not taking: Reported on 11/21/2018 08/22/17 11/21/18  Babara Greig GAILS, PA-C  ipratropium (ATROVENT ) 0.06 % nasal spray Place 2 sprays into both nostrils 4 (four) times daily. Patient not taking: Reported on 11/21/2018 08/22/17 11/21/18  Babara Greig GAILS, PA-C  loratadine  (CLARITIN ) 10 MG tablet Take 1 tablet (10 mg total) by mouth daily. Patient not taking: Reported on 11/21/2018 05/22/17 12/09/18  Christopher Savannah, PA-C    Family History Family History  Problem Relation Age of Onset   Diabetes Father     Social History Social History   Tobacco Use   Smoking status: Some Days    Current packs/day: 0.50    Types: Cigarettes   Smokeless tobacco: Never  Vaping Use   Vaping status: Never Used  Substance Use Topics   Alcohol use: No   Drug use: No  Allergies   Patient has no known allergies.   Review of Systems Review of Systems  Respiratory:  Positive for wheezing.    Per HPI  Physical Exam Triage Vital Signs ED Triage Vitals  Encounter Vitals Group     BP 08/04/23 1005 121/75     Girls Systolic BP Percentile --      Girls Diastolic BP Percentile --      Boys Systolic BP Percentile --      Boys Diastolic BP Percentile --      Pulse Rate 08/04/23 1005 97     Resp 08/04/23 1005 20     Temp 08/04/23 1005 98.1 F (36.7 C)     Temp Source 08/04/23 1005 Oral     SpO2 08/04/23 1005 94 %     Weight --      Height --      Head Circumference --       Peak Flow --      Pain Score 08/04/23 1006 0     Pain Loc --      Pain Education --      Exclude from Growth Chart --    No data found.  Updated Vital Signs BP 121/75 (BP Location: Right Arm)   Pulse 97   Temp 98.1 F (36.7 C) (Oral)   Resp 20   SpO2 94%   Visual Acuity Right Eye Distance:   Left Eye Distance:   Bilateral Distance:    Right Eye Near:   Left Eye Near:    Bilateral Near:     Physical Exam Vitals and nursing note reviewed.  Constitutional:      General: She is awake. She is not in acute distress.    Appearance: Normal appearance. She is well-developed and well-groomed. She is not ill-appearing.  HENT:     Right Ear: Tympanic membrane, ear canal and external ear normal.     Left Ear: Tympanic membrane, ear canal and external ear normal.     Nose: Congestion and rhinorrhea present.     Mouth/Throat:     Mouth: Mucous membranes are moist.     Pharynx: Posterior oropharyngeal erythema and postnasal drip present. No oropharyngeal exudate.   Cardiovascular:     Rate and Rhythm: Normal rate and regular rhythm.  Pulmonary:     Effort: Tachypnea present.     Breath sounds: Wheezing present.   Skin:    General: Skin is warm and dry.   Neurological:     Mental Status: She is alert.   Psychiatric:        Behavior: Behavior is cooperative.      UC Treatments / Results  Labs (all labs ordered are listed, but only abnormal results are displayed) Labs Reviewed - No data to display  EKG   Radiology No results found.  Procedures Procedures (including critical care time)  Medications Ordered in UC Medications  ipratropium-albuterol  (DUONEB) 0.5-2.5 (3) MG/3ML nebulizer solution 3 mL (3 mLs Nebulization Given 08/04/23 1025)  methylPREDNISolone  sodium succinate (SOLU-MEDROL ) 125 mg/2 mL injection 125 mg (125 mg Intramuscular Given 08/04/23 1025)    Initial Impression / Assessment and Plan / UC Course  I have reviewed the triage vital signs and  the nursing notes.  Pertinent labs & imaging results that were available during my care of the patient were reviewed by me and considered in my medical decision making (see chart for details).     Patient is overall well-appearing, but is tachypneic with speaking in  full sentences.  SPO2 94% on room air.  Congestion and rhinorrhea are present, and PND noted to pharynx.  Wheezing auscultated throughout all lung fields.  Ordered chest x-ray.  Patient my interpretation x-ray does reveal some bronchial thickening likely consistent with history of asthma.  Given DuoNeb and Solu-Medrol  in clinic with relief of shortness of breath and wheezing.  Prescribed prednisone  burst for asthma exacerbation.  Discussed use of albuterol  nebulizer and inhaler.  Prescribed Tessalon  and Promethazine DM as needed for cough.  Discussed over-the-counter medications as needed for symptoms.  Discussed follow-up, return, and strict ER precautions Final Clinical Impressions(s) / UC Diagnoses   Final diagnoses:  Acute cough  Moderate persistent asthma with exacerbation  Viral respiratory illness     Discharge Instructions      Start taking 2 tablets of prednisone  once daily for 5 days to assist with asthma exacerbation. Use albuterol  inhaler or nebulizer treatment every 6 hours as needed for wheezing or shortness of breath.  You do have multiple refills of nebulizer treatments already at your pharmacy that you can pick up today.  You also have an additional refill of your albuterol  inhaler when you need this. Take Tessalon  every 8 hours as needed for cough. Take Promethazine DM cough syrup at bedtime as needed for cough.  This can make you drowsy so do not drive, work, or drink alcohol while taking this. You can continue to use saline nasal spray and take over-the-counter Mucinex  to help with your symptoms. Follow-up with [facility] for further management and evaluation of your  asthma. Return here as needed. If you develop worsening trouble breathing, chest pain, dizziness, lightheadedness, passing out, or fevers unrelieved by medication please seek immediate medical treatment in the emergency department.     ED Prescriptions     Medication Sig Dispense Auth. Provider   predniSONE  (DELTASONE ) 20 MG tablet Take 2 tablets (40 mg total) by mouth daily for 5 days. 10 tablet Johnie Flaming A, NP   benzonatate  (TESSALON ) 100 MG capsule Take 1 capsule (100 mg total) by mouth every 8 (eight) hours. 21 capsule Johnie Flaming A, NP   promethazine-dextromethorphan (PROMETHAZINE-DM) 6.25-15 MG/5ML syrup Take 5 mLs by mouth at bedtime as needed for cough. 118 mL Johnie Flaming A, NP      PDMP not reviewed this encounter.   Johnie Flaming A, NP 08/04/23 [phone]
